# Patient Record
Sex: Male | Born: 1950
Health system: Southern US, Community
[De-identification: ages and names within clinical notes are randomized; demographics above are authoritative.]

## PROBLEM LIST (undated history)

## (undated) DIAGNOSIS — Z930 Tracheostomy status: Secondary | ICD-10-CM

## (undated) DIAGNOSIS — K219 Gastro-esophageal reflux disease without esophagitis: Secondary | ICD-10-CM

## (undated) DIAGNOSIS — I509 Heart failure, unspecified: Secondary | ICD-10-CM

## (undated) DIAGNOSIS — E785 Hyperlipidemia, unspecified: Secondary | ICD-10-CM

## (undated) DIAGNOSIS — M109 Gout, unspecified: Secondary | ICD-10-CM

## (undated) DIAGNOSIS — E119 Type 2 diabetes mellitus without complications: Secondary | ICD-10-CM

## (undated) HISTORY — PX: PACEMAKER PLACEMENT: SHX43

## (undated) HISTORY — PX: TRACHEOSTOMY: SUR1362

---

## 2004-04-08 ENCOUNTER — Other Ambulatory Visit: Payer: Self-pay

## 2004-04-08 ENCOUNTER — Emergency Department: Payer: Self-pay | Admitting: Emergency Medicine

## 2004-06-14 ENCOUNTER — Ambulatory Visit: Payer: Self-pay | Admitting: Internal Medicine

## 2014-09-01 DIAGNOSIS — Z95810 Presence of automatic (implantable) cardiac defibrillator: Secondary | ICD-10-CM | POA: Insufficient documentation

## 2015-01-01 DIAGNOSIS — Z720 Tobacco use: Secondary | ICD-10-CM | POA: Insufficient documentation

## 2015-01-01 DIAGNOSIS — I10 Essential (primary) hypertension: Secondary | ICD-10-CM | POA: Insufficient documentation

## 2015-01-01 DIAGNOSIS — E119 Type 2 diabetes mellitus without complications: Secondary | ICD-10-CM | POA: Insufficient documentation

## 2015-01-01 DIAGNOSIS — I2510 Atherosclerotic heart disease of native coronary artery without angina pectoris: Secondary | ICD-10-CM | POA: Insufficient documentation

## 2015-01-01 DIAGNOSIS — I5022 Chronic systolic (congestive) heart failure: Secondary | ICD-10-CM | POA: Insufficient documentation

## 2015-01-01 DIAGNOSIS — Z8709 Personal history of other diseases of the respiratory system: Secondary | ICD-10-CM | POA: Insufficient documentation

## 2015-01-01 DIAGNOSIS — J449 Chronic obstructive pulmonary disease, unspecified: Secondary | ICD-10-CM | POA: Insufficient documentation

## 2015-01-01 DIAGNOSIS — Z930 Tracheostomy status: Secondary | ICD-10-CM | POA: Insufficient documentation

## 2015-01-01 DIAGNOSIS — E785 Hyperlipidemia, unspecified: Secondary | ICD-10-CM | POA: Insufficient documentation

## 2015-01-03 DIAGNOSIS — I480 Paroxysmal atrial fibrillation: Secondary | ICD-10-CM | POA: Insufficient documentation

## 2018-07-11 ENCOUNTER — Encounter: Payer: Self-pay | Admitting: Intensive Care

## 2018-07-11 ENCOUNTER — Emergency Department
Admission: EM | Admit: 2018-07-11 | Discharge: 2018-07-11 | Disposition: A | Discharge status: Home / Self Care | Payer: Medicare Other | Attending: Emergency Medicine | Admitting: Emergency Medicine

## 2018-07-11 ENCOUNTER — Other Ambulatory Visit: Payer: Self-pay

## 2018-07-11 DIAGNOSIS — M79605 Pain in left leg: Secondary | ICD-10-CM | POA: Diagnosis present

## 2018-07-11 DIAGNOSIS — L03116 Cellulitis of left lower limb: Secondary | ICD-10-CM | POA: Diagnosis not present

## 2018-07-11 DIAGNOSIS — F17210 Nicotine dependence, cigarettes, uncomplicated: Secondary | ICD-10-CM | POA: Insufficient documentation

## 2018-07-11 DIAGNOSIS — I509 Heart failure, unspecified: Secondary | ICD-10-CM | POA: Diagnosis not present

## 2018-07-11 DIAGNOSIS — E119 Type 2 diabetes mellitus without complications: Secondary | ICD-10-CM | POA: Insufficient documentation

## 2018-07-11 HISTORY — DX: Heart failure, unspecified: I50.9

## 2018-07-11 HISTORY — DX: Type 2 diabetes mellitus without complications: E11.9

## 2018-07-11 LAB — CBC WITH DIFFERENTIAL/PLATELET
Abs Immature Granulocytes: 0.02 10*3/uL (ref 0.00–0.07)
Basophils Absolute: 0.1 10*3/uL (ref 0.0–0.1)
Basophils Relative: 2 %
Eosinophils Absolute: 0.1 10*3/uL (ref 0.0–0.5)
Eosinophils Relative: 1 %
HCT: 42.8 % (ref 39.0–52.0)
Hemoglobin: 13 g/dL (ref 13.0–17.0)
Immature Granulocytes: 0 %
Lymphocytes Relative: 10 %
Lymphs Abs: 0.6 10*3/uL — ABNORMAL LOW (ref 0.7–4.0)
MCH: 24.6 pg — ABNORMAL LOW (ref 26.0–34.0)
MCHC: 30.4 g/dL (ref 30.0–36.0)
MCV: 81.1 fL (ref 80.0–100.0)
Monocytes Absolute: 0.5 10*3/uL (ref 0.1–1.0)
Monocytes Relative: 9 %
Neutro Abs: 4.4 10*3/uL (ref 1.7–7.7)
Neutrophils Relative %: 78 %
Platelets: 116 10*3/uL — ABNORMAL LOW (ref 150–400)
RBC: 5.28 MIL/uL (ref 4.22–5.81)
RDW: 19.9 % — ABNORMAL HIGH (ref 11.5–15.5)
WBC: 5.7 10*3/uL (ref 4.0–10.5)
nRBC: 0 % (ref 0.0–0.2)

## 2018-07-11 LAB — COMPREHENSIVE METABOLIC PANEL
ALT: 20 U/L (ref 0–44)
AST: 18 U/L (ref 15–41)
Albumin: 3 g/dL — ABNORMAL LOW (ref 3.5–5.0)
Alkaline Phosphatase: 78 U/L (ref 38–126)
Anion gap: 11 (ref 5–15)
BUN: 51 mg/dL — ABNORMAL HIGH (ref 8–23)
CO2: 28 mmol/L (ref 22–32)
Calcium: 8.7 mg/dL — ABNORMAL LOW (ref 8.9–10.3)
Chloride: 93 mmol/L — ABNORMAL LOW (ref 98–111)
Creatinine, Ser: 1.54 mg/dL — ABNORMAL HIGH (ref 0.61–1.24)
GFR calc Af Amer: 53 mL/min — ABNORMAL LOW (ref >60)
GFR calc non Af Amer: 45 mL/min — ABNORMAL LOW (ref >60)
Glucose, Bld: 209 mg/dL — ABNORMAL HIGH (ref 70–99)
Potassium: 3.5 mmol/L (ref 3.5–5.1)
Sodium: 132 mmol/L — ABNORMAL LOW (ref 135–145)
Total Bilirubin: 1.6 mg/dL — ABNORMAL HIGH (ref 0.3–1.2)
Total Protein: 7.4 g/dL (ref 6.5–8.1)

## 2018-07-11 MED ORDER — DOXYCYCLINE HYCLATE 100 MG PO CAPS: 100.0000 mg | ORAL_CAPSULE | Freq: Two times a day (BID) | ORAL | 0 refills | Status: DC

## 2018-07-11 NOTE — ED Triage Notes (Signed)
Patient c/o cellulitis in left leg for a couple weeks. Reports draining blisters. New issue and has not spoke with PCP about. Denies cp or SOB

## 2018-07-11 NOTE — Discharge Instructions (Signed)
Please seek medical attention for any high fevers, chest pain, shortness of breath, change in behavior, persistent vomiting, bloody stool or any other new or concerning symptoms.  

## 2018-07-11 NOTE — ED Provider Notes (Signed)
[facility] Emergency Department Provider Note  ____________________________________________   I have reviewed the triage vital signs and the nursing notes.   HISTORY  Chief Complaint Cellulitis   History limited by: Not Limited   HPI Mohammad Katrinka BlazingSmith is a 68 y.o. male who presents to the emergency department today because of concern for left leg infection. The patient states that he has history of fluid retention and that it had gotten bad a couple of weeks ago. It then blistered and since then he has been worried he has had an infection in his leg. The patient states that he has had pain and some drainage. Says that he sought care today because the pain has continued to get worse. The patient denies any fevers. Denies any nausea or vomiting.   Records reviewed. Per medical record review patient has a history of CHF, DM.   Past Medical History:  Diagnosis Date  . CHF (congestive heart failure) (HCC)   . Diabetes mellitus without complication (HCC)     There are no active problems to display for this patient.   History reviewed. No pertinent surgical history.  Prior to Admission medications   Not on File    Allergies Patient has no known allergies.  History reviewed. No pertinent family history.  Social History Social History   Tobacco Use  . Smoking status: Current Every Day Smoker    Packs/day: 1.00    Types: Cigarettes  . Smokeless tobacco: Never Used  Substance Use Topics  . Alcohol use: Never    Frequency: Never  . Drug use: Never    Review of Systems Constitutional: No fever/chills Eyes: No visual changes. ENT: No sore throat. Cardiovascular: Denies chest pain. Respiratory: Denies shortness of breath. Gastrointestinal: No abdominal pain.  No nausea, no vomiting.  No diarrhea.   Genitourinary: Negative for dysuria. Musculoskeletal: Positive for left leg pain. Skin: Positive for swelling and redness to left leg. Neurological:  Negative for headaches, focal weakness or numbness.  ____________________________________________   PHYSICAL EXAM:  VITAL SIGNS: ED Triage Vitals  Enc Vitals Group     BP 07/11/18 1433 124/69     Pulse Rate 07/11/18 1433 78     Resp 07/11/18 1433 18     Temp 07/11/18 1429 98.1 F (36.7 C)     Temp Source 07/11/18 1429 Oral     SpO2 07/11/18 1433 98 %     Weight 07/11/18 1430 205 lb (93 kg)     Height 07/11/18 1430 5\' 2"  (1.575 m)     Head Circumference --      Peak Flow --      Pain Score 07/11/18 1430 6   Constitutional: Alert and oriented.  Eyes: Conjunctivae are normal.  ENT      Head: Normocephalic and atraumatic.      Nose: No congestion/rhinnorhea.      Mouth/Throat: Mucous membranes are moist.      Neck: No stridor. Hematological/Lymphatic/Immunilogical: No cervical lymphadenopathy. Cardiovascular: Normal rate, regular rhythm.  No murmurs, rubs, or gallops.  Respiratory: Normal respiratory effort without tachypnea nor retractions. Breath sounds are clear and equal bilaterally. No wheezes/rales/rhonchi. Gastrointestinal: Soft and non tender. No rebound. No guarding.  Genitourinary: Deferred Musculoskeletal: Swelling to left lower leg. Tender to palpation.  Neurologic:  Normal speech and language. No gross focal neurologic deficits are appreciated.  Skin:  Erythema and warmth to left lower leg. Small amount of drainage from two ulcer type wounds of the lower leg.  Psychiatric: Mood  and affect are normal. Speech and behavior are normal. Patient exhibits appropriate insight and judgment.  ____________________________________________    LABS (pertinent positives/negatives)  CBC wbc 5.7, hgb 13.0, plt 116 CMP na 132, cl 93, glu 209, cr 1.54  ____________________________________________   EKG  None  ____________________________________________     RADIOLOGY  None  ____________________________________________   PROCEDURES  Procedures  ____________________________________________   INITIAL IMPRESSION / ASSESSMENT AND PLAN / ED COURSE  Pertinent labs & imaging results that were available during my care of the patient were reviewed by me and considered in my medical decision making (see chart for details).   Patient presented because of concern for infection to his left lower leg. On exam patient does have erythema and tenderness to his left lower leg. Two areas of ulceration. Patient afebrile, no leukocytosis, at this time do not feel he is septic. Will give patient prescription for antibiotics. Will give patient wound care center follow up. Discussed return precautions with the patient.   ___________________________________________   FINAL CLINICAL IMPRESSION(S) / ED DIAGNOSES  Final diagnoses:  Cellulitis of left lower extremity     Note: This dictation was prepared with Dragon dictation. Any transcriptional errors that result from this process are unintentional     Phineas Semen, MD 07/11/18 1645

## 2018-07-11 NOTE — ED Notes (Signed)
Pt states fluid retention caused his to get blistered- blisters busted a couple weeks ago and now he thinks they are infected as they are not healing and hurt really bad

## 2018-07-17 ENCOUNTER — Other Ambulatory Visit: Payer: Self-pay

## 2018-07-17 ENCOUNTER — Emergency Department: Payer: Medicare Other

## 2018-07-17 ENCOUNTER — Emergency Department
Admission: EM | Admit: 2018-07-17 | Discharge: 2018-07-17 | Disposition: A | Discharge status: Other Acute Inpatient Hospital | Payer: Medicare Other | Attending: Emergency Medicine | Admitting: Emergency Medicine

## 2018-07-17 DIAGNOSIS — I110 Hypertensive heart disease with heart failure: Secondary | ICD-10-CM | POA: Insufficient documentation

## 2018-07-17 DIAGNOSIS — F17210 Nicotine dependence, cigarettes, uncomplicated: Secondary | ICD-10-CM | POA: Insufficient documentation

## 2018-07-17 DIAGNOSIS — R778 Other specified abnormalities of plasma proteins: Secondary | ICD-10-CM

## 2018-07-17 DIAGNOSIS — I5022 Chronic systolic (congestive) heart failure: Secondary | ICD-10-CM | POA: Diagnosis not present

## 2018-07-17 DIAGNOSIS — L03116 Cellulitis of left lower limb: Secondary | ICD-10-CM | POA: Diagnosis not present

## 2018-07-17 DIAGNOSIS — T148XXA Other injury of unspecified body region, initial encounter: Secondary | ICD-10-CM

## 2018-07-17 DIAGNOSIS — Z1159 Encounter for screening for other viral diseases: Secondary | ICD-10-CM | POA: Insufficient documentation

## 2018-07-17 DIAGNOSIS — R2242 Localized swelling, mass and lump, left lower limb: Secondary | ICD-10-CM | POA: Diagnosis present

## 2018-07-17 DIAGNOSIS — N189 Chronic kidney disease, unspecified: Secondary | ICD-10-CM

## 2018-07-17 DIAGNOSIS — E119 Type 2 diabetes mellitus without complications: Secondary | ICD-10-CM | POA: Insufficient documentation

## 2018-07-17 DIAGNOSIS — R7989 Other specified abnormal findings of blood chemistry: Secondary | ICD-10-CM | POA: Insufficient documentation

## 2018-07-17 DIAGNOSIS — L089 Local infection of the skin and subcutaneous tissue, unspecified: Secondary | ICD-10-CM

## 2018-07-17 LAB — LIPASE, BLOOD: Lipase: 27 U/L (ref 11–51)

## 2018-07-17 LAB — COMPREHENSIVE METABOLIC PANEL
ALT: 13 U/L (ref 0–44)
AST: 20 U/L (ref 15–41)
Albumin: 3.3 g/dL — ABNORMAL LOW (ref 3.5–5.0)
Alkaline Phosphatase: 83 U/L (ref 38–126)
Anion gap: 11 (ref 5–15)
BUN: 71 mg/dL — ABNORMAL HIGH (ref 8–23)
CO2: 30 mmol/L (ref 22–32)
Calcium: 8.7 mg/dL — ABNORMAL LOW (ref 8.9–10.3)
Chloride: 96 mmol/L — ABNORMAL LOW (ref 98–111)
Creatinine, Ser: 1.77 mg/dL — ABNORMAL HIGH (ref 0.61–1.24)
GFR calc Af Amer: 44 mL/min — ABNORMAL LOW (ref >60)
GFR calc non Af Amer: 38 mL/min — ABNORMAL LOW (ref >60)
Glucose, Bld: 136 mg/dL — ABNORMAL HIGH (ref 70–99)
Potassium: 3.3 mmol/L — ABNORMAL LOW (ref 3.5–5.1)
Sodium: 137 mmol/L (ref 135–145)
Total Bilirubin: 1 mg/dL (ref 0.3–1.2)
Total Protein: 7.9 g/dL (ref 6.5–8.1)

## 2018-07-17 LAB — CBC WITH DIFFERENTIAL/PLATELET
Abs Immature Granulocytes: 0.02 10*3/uL (ref 0.00–0.07)
Basophils Absolute: 0.1 10*3/uL (ref 0.0–0.1)
Basophils Relative: 2 %
Eosinophils Absolute: 0.1 10*3/uL (ref 0.0–0.5)
Eosinophils Relative: 2 %
HCT: 42.7 % (ref 39.0–52.0)
Hemoglobin: 13 g/dL (ref 13.0–17.0)
Immature Granulocytes: 0 %
Lymphocytes Relative: 11 %
Lymphs Abs: 0.6 10*3/uL — ABNORMAL LOW (ref 0.7–4.0)
MCH: 25 pg — ABNORMAL LOW (ref 26.0–34.0)
MCHC: 30.4 g/dL (ref 30.0–36.0)
MCV: 82 fL (ref 80.0–100.0)
Monocytes Absolute: 0.8 10*3/uL (ref 0.1–1.0)
Monocytes Relative: 13 %
Neutro Abs: 4 10*3/uL (ref 1.7–7.7)
Neutrophils Relative %: 72 %
Platelets: 136 10*3/uL — ABNORMAL LOW (ref 150–400)
RBC: 5.21 MIL/uL (ref 4.22–5.81)
RDW: 19.9 % — ABNORMAL HIGH (ref 11.5–15.5)
WBC: 5.6 10*3/uL (ref 4.0–10.5)
nRBC: 0 % (ref 0.0–0.2)

## 2018-07-17 LAB — PROTIME-INR
INR: 1.7 — ABNORMAL HIGH (ref 0.8–1.2)
Prothrombin Time: 19.5 seconds — ABNORMAL HIGH (ref 11.4–15.2)

## 2018-07-17 LAB — BLOOD GAS, VENOUS
Acid-Base Excess: 5.8 mmol/L — ABNORMAL HIGH (ref 0.0–2.0)
Bicarbonate: 31.7 mmol/L — ABNORMAL HIGH (ref 20.0–28.0)
O2 Saturation: 62.4 %
Patient temperature: 37
pCO2, Ven: 50 mmHg (ref 44.0–60.0)
pH, Ven: 7.41 (ref 7.250–7.430)
pO2, Ven: 32 mmHg (ref 32.0–45.0)

## 2018-07-17 LAB — TROPONIN I: Troponin I: 0.03 ng/mL (ref <0.03)

## 2018-07-17 LAB — BRAIN NATRIURETIC PEPTIDE: B Natriuretic Peptide: 1207 pg/mL — ABNORMAL HIGH (ref 0.0–100.0)

## 2018-07-17 LAB — LACTIC ACID, PLASMA: Lactic Acid, Venous: 1.1 mmol/L (ref 0.5–1.9)

## 2018-07-17 LAB — PROCALCITONIN: Procalcitonin: 0.19 ng/mL

## 2018-07-17 LAB — SARS CORONAVIRUS 2 BY RT PCR (HOSPITAL ORDER, PERFORMED IN ~~LOC~~ HOSPITAL LAB): SARS Coronavirus 2: NEGATIVE

## 2018-07-17 MED ORDER — MORPHINE SULFATE (PF) 4 MG/ML IV SOLN
4.0000 mg | Freq: Once | INTRAVENOUS | Status: AC
  Administered 2018-07-17: 4 mg via INTRAVENOUS
  Filled 2018-07-17: qty 1

## 2018-07-17 MED ORDER — PIPERACILLIN-TAZOBACTAM 3.375 G IVPB 30 MIN
3.3750 g | Freq: Once | INTRAVENOUS | Status: AC
  Administered 2018-07-17: 3.375 g via INTRAVENOUS
  Filled 2018-07-17: qty 50

## 2018-07-17 MED ORDER — VANCOMYCIN HCL IN DEXTROSE 1-5 GM/200ML-% IV SOLN: 1000.0000 mg | Freq: Once | INTRAVENOUS | Status: DC

## 2018-07-17 MED ORDER — VANCOMYCIN HCL 1.25 G IV SOLR: 1250.0000 mg | INTRAVENOUS | Status: DC

## 2018-07-17 MED ORDER — ONDANSETRON HCL 4 MG/2ML IJ SOLN
4.0000 mg | INTRAMUSCULAR | Status: AC
  Administered 2018-07-17: 4 mg via INTRAVENOUS
  Filled 2018-07-17: qty 2

## 2018-07-17 MED ORDER — VANCOMYCIN HCL 10 G IV SOLR
2000.0000 mg | Freq: Once | INTRAVENOUS | Status: AC
  Administered 2018-07-17: 2000 mg via INTRAVENOUS
  Filled 2018-07-17: qty 2000

## 2018-07-17 MED ORDER — MORPHINE SULFATE (PF) 2 MG/ML IV SOLN
2.0000 mg | Freq: Once | INTRAVENOUS | Status: AC
Start: 2018-07-17 — End: 2018-07-17
  Administered 2018-07-17: 2 mg via INTRAVENOUS
  Filled 2018-07-17: qty 1

## 2018-07-17 NOTE — ED Notes (Signed)
Given water

## 2018-07-17 NOTE — ED Provider Notes (Addendum)
[facility] Emergency Department Provider Note  ____________________________________________   First MD Initiated Contact with Patient 07/17/18 [phone]     (approximate)  I have reviewed the triage vital signs and the nursing notes.   HISTORY  Chief Complaint Leg Swelling    HPI Danny Glover is a 68 y.o. male with extensive chronic medical history  as listed below in past medical history and active problem list.  He presents by EMS for evaluation of worsening pain, swelling, redness, and weeping of fluid from his left lower leg.  He was seen in this emergency department about 6 days ago for worsening wound and started on antibiotics for probable wound infection.  He says he has been taking his antibiotics and all of his regular medications but the wound is gotten progressively more painful and he is having more redness and swelling in the left leg although it is difficult for him to see it.  He has extensive chronic skin changes in bilateral lower extremities and feet but he has 2 wounds that have been present but seem to be getting worse on the left lower leg.  The one on the medial aspect of the leg seems to be doing okay but the one on the lateral aspect just below the knee is getting worse.  He is having some cough and a little bit of shortness of breath but he thinks that is due to the worsening swelling which he attributes to an increased fluid volume.  He says he has been taking his medicines which includes torsemide.  He also says that he takes a blood thinner which he believes is warfarin.  He denies fever/chills, chest pain, nausea, vomiting, and abdominal pain.  He has no one at home to help him and has been isolating from potential COVID-19 patients.  He has a chronic tracheostomy and he has not been worried about his respiratory symptoms and attributes them to fluid buildup.  He describes the pain in his left lower leg is severe and nothing in particular makes it  better or worse and it is been getting worse in spite of the antibiotics he received.        Past Medical History:  Diagnosis Date   CHF (congestive heart failure) (HCC)    Diabetes mellitus without complication [facility](HCC)     Patient Active Problem List   Diagnosis Date Noted   Paroxysmal atrial fibrillation (HCC) 01/03/2015   Chronic obstructive pulmonary disease (HCC) 01/01/2015   Chronic systolic (congestive) heart failure (HCC) 01/01/2015   Controlled type 2 diabetes mellitus without complication (HCC) 01/01/2015   Coronary artery disease involving native coronary artery 01/01/2015   Essential hypertension 01/01/2015   History of vocal cord paralysis 01/01/2015   Hyperlipidemia 01/01/2015   Tobacco abuse 01/01/2015   Tracheostomy dependent (HCC) 01/01/2015   Biventricular ICD (implantable cardioverter-defibrillator) in place 09/01/2014    History reviewed. No pertinent surgical history.  Prior to Admission medications   Medication Sig Start Date End Date Taking? Authorizing Provider  doxycycline (VIBRAMYCIN) 100 MG capsule Take 1 capsule (100 mg total) by mouth 2 (two) times daily for 10 days. 07/11/18 07/21/18  Phineas SemenGoodman, Graydon, MD    Allergies Patient has no known allergies.  History reviewed. No pertinent family history.  Social History Social History   Tobacco Use   Smoking status: Current Every Day Smoker    Packs/day: 1.00    Types: Cigarettes   Smokeless tobacco: Never Used  Substance Use Topics   Alcohol  use: Never    Frequency: Never   Drug use: Never    Review of Systems Constitutional: No fever/chills Eyes: No visual changes. ENT: No sore throat. Cardiovascular: Denies chest pain. Respiratory: Some shortness of breath and mild cough.  Chronic tracheostomy. Gastrointestinal: No abdominal pain.  No nausea, no vomiting.  No diarrhea.  No constipation. Genitourinary: Negative for dysuria. Musculoskeletal: Pain in left lower leg with  some redness and swelling.  Negative for neck pain.  Negative for back pain. Integumentary: Worsening wound infection on left lower leg. Neurological: Negative for headaches, focal weakness or numbness.   ____________________________________________   PHYSICAL EXAM:  VITAL SIGNS: ED Triage Vitals  Enc Vitals Group     BP 07/17/18 0443 135/79     Pulse Rate 07/17/18 0443 77     Resp 07/17/18 0443 20     Temp 07/17/18 0443 97.6 F (36.4 C)     Temp Source 07/17/18 0443 Oral     SpO2 07/17/18 0434 98 %     Weight 07/17/18 0444 93 kg (205 lb)     Height 07/17/18 0444 1.753 m ( )     Head Circumference --      Peak Flow --      Pain Score 07/17/18 0444 10     Pain Loc --      Pain Edu? --      Excl. in GC? --     Constitutional: Alert and oriented.  Appears chronically ill and uncomfortable but is not in acute distress right now. Eyes: Conjunctivae are normal.  Head: Atraumatic. Nose: No congestion/rhinnorhea. Mouth/Throat: Mucous membranes are moist. Neck: No stridor.  No meningeal signs.   Cardiovascular: Normal rate, regular rhythm. Good peripheral circulation. Grossly normal heart sounds. Respiratory: Slightly increased respiratory effort with some intercostal muscle usage.  No audible wheezing.  Coarse breath sounds particularly in the bases.  Chronic tracheostomy. Gastrointestinal: Soft and nontender. No distention.  Musculoskeletal: Skin changes of the bilateral lower extremities as listed below.  There is some trace edema in bilateral lower extremities.  Severe chronic skin changes to the feet but no evidence of acute infection on his feet.  He is missing several toenails including the great toenail on the right foot that appears to have been ripped off at some point but the patient is unaware of when this may have happened.  See below for a photo. Neurologic:  Normal speech and language. No gross focal neurologic deficits are appreciated.  Skin:  Skin is warm, dry,  pallor.  Extensive chronic skin changes in bilateral lower extremities.  The left lower extremity has 2 large chronic appearing wounds with a central eschar, one on the lateral aspect just below the knee and the other on the posterior medial aspect further down the leg.  The lateral wound appears acutely infected and is weeping fluid and is mildly purulent.  It is at least 3 cm in diameter and has extensive surrounding cellulitis that goes up to the knee.  It is very tender to palpation.      ____________________________________________   LABS (all labs ordered are listed, but only abnormal results are displayed)  Labs Reviewed  COMPREHENSIVE METABOLIC PANEL - Abnormal; Notable for the following components:      Result Value   Potassium 3.3 (*)    Chloride 96 (*)    Glucose, Bld 136 (*)    BUN 71 (*)    Creatinine, Ser 1.77 (*)    Calcium 8.7 (*)  Albumin 3.3 (*)    GFR calc non Af Amer 38 (*)    GFR calc Af Amer 44 (*)    All other components within normal limits  BRAIN NATRIURETIC PEPTIDE - Abnormal; Notable for the following components:   B Natriuretic Peptide 1,207.0 (*)    All other components within normal limits  TROPONIN I - Abnormal; Notable for the following components:   Troponin I 0.03 (*)    All other components within normal limits  CBC WITH DIFFERENTIAL/PLATELET - Abnormal; Notable for the following components:   MCH 25.0 (*)    RDW 19.9 (*)    Platelets 136 (*)    Lymphs Abs 0.6 (*)    All other components within normal limits  BLOOD GAS, VENOUS - Abnormal; Notable for the following components:   Bicarbonate 31.7 (*)    Acid-Base Excess 5.8 (*)    All other components within normal limits  PROTIME-INR - Abnormal; Notable for the following components:   Prothrombin Time 19.5 (*)    INR 1.7 (*)    All other components within normal limits  SARS CORONAVIRUS 2 (HOSPITAL ORDER, PERFORMED IN [facility])  CULTURE, BLOOD (ROUTINE X 2)  CULTURE,  BLOOD (ROUTINE X 2)  AEROBIC/ANAEROBIC CULTURE (SURGICAL/DEEP WOUND)  LIPASE, BLOOD  LACTIC ACID, PLASMA  PROCALCITONIN  URINALYSIS, COMPLETE (UACMP) WITH MICROSCOPIC   ____________________________________________  EKG  ED ECG REPORT I, Loleta Rose, the attending physician, personally viewed and interpreted this ECG.  Date: 07/17/2018 EKG Time: 4:40 AM Rate: 82 Rhythm: Atrial sensed ventricular paced rhythm QRS Axis: Atrial sensed ventricular paced rhythm Intervals: Atrial sensed ventricular paced rhythm ST/T Wave abnormalities: Non-specific ST segment / T-wave changes, but no clear evidence of acute ischemia. Narrative Interpretation: no definitive evidence of acute ischemia; does not meet STEMI criteria.   ____________________________________________  RADIOLOGY I, Loleta Rose, personally viewed and evaluated these images (plain radiographs) as part of my medical decision making, as well as reviewing the written report by the radiologist.  ED MD interpretation:  Chronic scarring vs acute infection, favor the former  Official radiology report(s): Dg Chest Port 1 View  Result Date: 07/17/2018 CLINICAL DATA:  68 year old male with fluid retention. Shortness of breath and leg swelling. EXAM: PORTABLE CHEST 1 VIEW COMPARISON:  None available. FINDINGS: Portable AP upright view at 0446 hours. Tracheostomy tube in place. Left chest cardiac AICD. Cardiomegaly. Prior CABG. Other mediastinal contours are within normal limits. Patchy right mid lung opacity and veiling/blunting of the right lung base and costophrenic angle. The hypo ventilation at the left base without confluent opacity. No pneumothorax, and no overt edema. Upper lung vascularity appears at the upper limits of normal. Negative visible bowel gas pattern. No acute osseous abnormality identified. IMPRESSION: 1. No prior images for comparison. 2. Patchy right mid and lower lung opacity with blunting of the right costophrenic  angle. This more resembles infection or chronic scarring than a pleural effusion. 3. Cardiomegaly without overt pulmonary edema. Electronically Signed   By: Odessa Fleming M.D.   On: 07/17/2018 05:08    ____________________________________________   PROCEDURES   Procedure(s) performed (including Critical Care):  Procedures   ____________________________________________   INITIAL IMPRESSION / MDM / ASSESSMENT AND PLAN / ED COURSE  As part of my medical decision making, I reviewed the following data within the electronic MEDICAL RECORD NUMBER Nursing notes reviewed and incorporated, Labs reviewed , EKG interpreted , Old chart reviewed, Radiograph reviewed  and Notes from prior ED visits.  Contacting [facility] [place] for transfer.      *Jeferson Carroll was evaluated in Emergency Department on 07/17/2018 for the symptoms described in the history of present illness. He was evaluated in the context of the global COVID-19 pandemic, which necessitated consideration that the patient might be at risk for infection with the SARS-CoV-2 virus that causes COVID-19. Institutional protocols and algorithms that pertain to the evaluation of patients at risk for COVID-19 are in a state of rapid change based on information released by regulatory bodies including the CDC and federal and state organizations. These policies and algorithms were followed during the patient's care in the ED.*  Differential diagnosis includes, but is not limited to, cellulitis of left lower extremity secondary to wound infection unresponsive to outpatient antibiotics, DVT, necrotizing fasciitis, CHF exacerbation, pneumonia, sepsis.  The patient does not (yet) meet criteria for sepsis as his vital signs are stable and he is afebrile.  However it appears that his leg is been getting worse.  The physician that saw him 6 days ago happens to be working tonight as well and he evaluated the patient with me and said that the left lower leg looks worse with more  surrounding cellulitis than was present on the initial visit 6 days ago in spite of the use of outpatient antibiotics.  I will check blood cultures, a wound culture of the lateral wound, and evaluate broadly.  However I feel that he will likely need to be admitted for IV antibiotics and most likely for vascular surgery consultation.  However the patient is a [place] patient and goes to [place] so when I have sufficient information available I will contact the [facility] [place] for possible transfer.  A COVID-19 swab has been sent.  The patient is in no respiratory distress at this time.  Clinical Course as of Jul 16 737  Tue Jul 17, 2018  4098 Unlikely to represent ACS, possibly a small element of demand ischemia, more likely representative of acute on chronic renal insufficiency.  Troponin I(!!): 0.03 [CF]  0524 No leukocytosis, stable hemoglobin  CBC with Differential(!) [CF]  0525 Increased creatinine from last week's ED visit, but uncertain of baseline at [facility].  Creatinine(!): 1.77 [CF]  0527 chronic scarring vs infection  DG Chest [facility] [CF]  [phone] SARS Coronavirus 2: NEGATIVE [CF]  0635 Blood cultures are pending.  Venous blood gases generally reassuring with no evidence of significant hypercapnia.   [CF]  0636 B Natriuretic Peptide(!): 1,207.0 [CF]  0636 Patient's presentation is most consistent with worsening cellulitis of left lower extremity refractory to outpatient antibiotics (he was taking doxycycline for about a week).  I have ordered Zosyn 3.375 g IV and vancomycin 2 g IV to begin empiric treatment.  Wound culture is pending and blood cultures are pending.  I am filling out paperwork for transfer to the [facility] if a bed is available.  If they do not have room for him then he should be admitted to this facility for her cellulitis treatment and volume overload/CHF exacerbation treatment.  He agrees with the plan.   [CF]  [phone] Still waiting to hear back from the [facility].  Transferred ED  care to Dr. Lenard Lance to follow-up.   [CF]    Clinical Course User Index [CF] Loleta Rose, MD     ____________________________________________  FINAL CLINICAL IMPRESSION(S) / ED DIAGNOSES  Final diagnoses:  Cellulitis of left lower leg  Wound infection  Elevated troponin I level  Acute on chronic  renal insufficiency     MEDICATIONS GIVEN DURING THIS VISIT:  Medications  vancomycin (VANCOCIN) 2,000 mg in sodium chloride 0.9 % 500 mL IVPB (2,000 mg Intravenous New Bag/Given 07/17/18 0549)  vancomycin (VANCOCIN) 1,250 mg in sodium chloride 0.9 % 250 mL IVPB (has no administration in time range)  morphine 4 MG/ML injection 4 mg (has no administration in time range)  ondansetron (ZOFRAN) injection 4 mg (has no administration in time range)  piperacillin-tazobactam (ZOSYN) IVPB 3.375 g (0 g Intravenous Stopped 07/17/18 0610)     ED Discharge Orders    None       Note:  This document was prepared using Dragon voice recognition software and may include unintentional dictation errors.   Loleta Rose, MD 07/17/18 8315    Loleta Rose, MD 07/17/18 [phone]

## 2018-07-17 NOTE — ED Notes (Signed)
Leaving with [facility] to [facility] hospital. Stable on discharge. NAD.

## 2018-07-17 NOTE — ED Notes (Signed)
Meal tray ordered 

## 2018-07-17 NOTE — ED Notes (Signed)
[facility]  CALLED  FOR  TRANSPORT TO  [place] 

## 2018-07-17 NOTE — ED Notes (Signed)
EMTALA reviewed by charge RN 

## 2018-07-17 NOTE — Consult Note (Signed)
Pharmacy Antibiotic Note  Danny Glover is a 68 y.o. male admitted on 07/17/2018 with cellulitis.  Pharmacy has been consulted for vancomycin dosing.  Plan: Vancomycin 2000mg  IV x 1 dose, followed by Vancomycin 1250  mg IV Q 36  hrs. Goal AUC 400-550. Expected AUC: 483 SCr used: 1.77  Will monitor renal function and adjust dose as needed.   Height: 5\' 9"  (175.3 cm) Weight: 205 lb (93 kg) IBW/kg (Calculated) : 70.7  Temp (24hrs), Avg:97.6 F (36.4 C), Min:97.6 F (36.4 C), Max:97.6 F (36.4 C)  Recent Labs  Lab 07/11/18 1440 07/17/18 0445 07/17/18 0526  WBC 5.7 5.6  --   CREATININE 1.54* 1.77*  --   LATICACIDVEN  --   --  1.1    Estimated Creatinine Clearance: 44.3 mL/min (A) (by C-G formula based on SCr of 1.77 mg/dL (H)).    No Known Allergies  Antimicrobials this admission: 5/12 vancomycin>>  5/12 Zosyn  >>   Microbiology results: 5/12 BCx: pending 0446 SARS Coronavirus 2: negative   Thank you for allowing pharmacy to be a part of this patient's care.  Gardner Candle, PharmD, BCPS Clinical Pharmacist 07/17/2018 7:03 AM

## 2018-07-17 NOTE — ED Notes (Signed)
Copy  Of  [facility]ray  [facility]  With  Pt  [facility]

## 2018-07-17 NOTE — ED Notes (Signed)
ALL  PAPERWORK  FAXED  TO  [place]  [facility] 

## 2018-07-17 NOTE — ED Notes (Signed)
Patients mother called wanting update and info. Discussed with patient and he does not want her to have any information at this time.  Informed mother that cannot tell her anything at this time.

## 2018-07-17 NOTE — ED Notes (Signed)
[facility]  STILL  BEING  REVIEWED BY  MD INFORMED  VALERIE  RN  AND  DR Lenard Lance  MD

## 2018-07-17 NOTE — ED Notes (Signed)
Pt alert and oriented. No needs at this time. Unlabored. Waiting on possible transfer to [facility] hospital.  Rails up X 2 for safety.

## 2018-07-17 NOTE — ED Notes (Signed)
Given meal tray.

## 2018-07-17 NOTE — ED Triage Notes (Signed)
68 yo male from home by [facility] with complaints of bilateral edema with the left weeping. Per patient he had a fall yesterday. Patient is complaining of pain bilateral in legs 10/10. Patient's left leg is weeping clear fluid and has multiple sores on leg. On interior of left lower leg is area is red with swelling and on outer left lower leg is area 2 in diameter area that is blackened and weeping. Patient states he was just seen because of legs and was placed and ABT and has been taking medications.

## 2018-07-22 LAB — CULTURE, BLOOD (ROUTINE X 2)
Culture: NO GROWTH
Culture: NO GROWTH
Special Requests: ADEQUATE
Special Requests: ADEQUATE

## 2018-07-22 LAB — AEROBIC/ANAEROBIC CULTURE W GRAM STAIN (SURGICAL/DEEP WOUND): Gram Stain: NONE SEEN

## 2018-07-22 LAB — AEROBIC/ANAEROBIC CULTURE (SURGICAL/DEEP WOUND): Special Requests: NORMAL

## 2018-11-06 DIAGNOSIS — J449 Chronic obstructive pulmonary disease, unspecified: Secondary | ICD-10-CM | POA: Diagnosis not present

## 2018-11-06 DIAGNOSIS — I130 Hypertensive heart and chronic kidney disease with heart failure and stage 1 through stage 4 chronic kidney disease, or unspecified chronic kidney disease: Secondary | ICD-10-CM | POA: Diagnosis not present

## 2018-11-06 DIAGNOSIS — Z7984 Long term (current) use of oral hypoglycemic drugs: Secondary | ICD-10-CM | POA: Diagnosis not present

## 2018-11-06 DIAGNOSIS — N182 Chronic kidney disease, stage 2 (mild): Secondary | ICD-10-CM | POA: Diagnosis not present

## 2018-11-06 DIAGNOSIS — E1140 Type 2 diabetes mellitus with diabetic neuropathy, unspecified: Secondary | ICD-10-CM | POA: Diagnosis not present

## 2018-11-06 DIAGNOSIS — I5030 Unspecified diastolic (congestive) heart failure: Secondary | ICD-10-CM | POA: Diagnosis not present

## 2018-11-06 DIAGNOSIS — I4891 Unspecified atrial fibrillation: Secondary | ICD-10-CM | POA: Diagnosis not present

## 2018-11-06 DIAGNOSIS — I872 Venous insufficiency (chronic) (peripheral): Secondary | ICD-10-CM | POA: Diagnosis not present

## 2018-11-06 DIAGNOSIS — Z930 Tracheostomy status: Secondary | ICD-10-CM | POA: Diagnosis not present

## 2018-11-06 DIAGNOSIS — I2510 Atherosclerotic heart disease of native coronary artery without angina pectoris: Secondary | ICD-10-CM | POA: Diagnosis not present

## 2018-11-06 DIAGNOSIS — E1122 Type 2 diabetes mellitus with diabetic chronic kidney disease: Secondary | ICD-10-CM | POA: Diagnosis not present

## 2018-11-06 DIAGNOSIS — L97221 Non-pressure chronic ulcer of left calf limited to breakdown of skin: Secondary | ICD-10-CM | POA: Diagnosis not present

## 2018-11-08 DIAGNOSIS — I872 Venous insufficiency (chronic) (peripheral): Secondary | ICD-10-CM | POA: Diagnosis not present

## 2018-11-08 DIAGNOSIS — N182 Chronic kidney disease, stage 2 (mild): Secondary | ICD-10-CM | POA: Diagnosis not present

## 2018-11-08 DIAGNOSIS — E1122 Type 2 diabetes mellitus with diabetic chronic kidney disease: Secondary | ICD-10-CM | POA: Diagnosis not present

## 2018-11-08 DIAGNOSIS — J449 Chronic obstructive pulmonary disease, unspecified: Secondary | ICD-10-CM | POA: Diagnosis not present

## 2018-11-08 DIAGNOSIS — I2510 Atherosclerotic heart disease of native coronary artery without angina pectoris: Secondary | ICD-10-CM | POA: Diagnosis not present

## 2018-11-08 DIAGNOSIS — I5030 Unspecified diastolic (congestive) heart failure: Secondary | ICD-10-CM | POA: Diagnosis not present

## 2018-11-08 DIAGNOSIS — E1140 Type 2 diabetes mellitus with diabetic neuropathy, unspecified: Secondary | ICD-10-CM | POA: Diagnosis not present

## 2018-11-08 DIAGNOSIS — L97221 Non-pressure chronic ulcer of left calf limited to breakdown of skin: Secondary | ICD-10-CM | POA: Diagnosis not present

## 2018-11-08 DIAGNOSIS — Z7984 Long term (current) use of oral hypoglycemic drugs: Secondary | ICD-10-CM | POA: Diagnosis not present

## 2018-11-08 DIAGNOSIS — I4891 Unspecified atrial fibrillation: Secondary | ICD-10-CM | POA: Diagnosis not present

## 2018-11-08 DIAGNOSIS — I130 Hypertensive heart and chronic kidney disease with heart failure and stage 1 through stage 4 chronic kidney disease, or unspecified chronic kidney disease: Secondary | ICD-10-CM | POA: Diagnosis not present

## 2018-11-08 DIAGNOSIS — Z930 Tracheostomy status: Secondary | ICD-10-CM | POA: Diagnosis not present

## 2018-11-09 DIAGNOSIS — I4891 Unspecified atrial fibrillation: Secondary | ICD-10-CM | POA: Diagnosis not present

## 2018-11-09 DIAGNOSIS — E1122 Type 2 diabetes mellitus with diabetic chronic kidney disease: Secondary | ICD-10-CM | POA: Diagnosis not present

## 2018-11-09 DIAGNOSIS — Z7984 Long term (current) use of oral hypoglycemic drugs: Secondary | ICD-10-CM | POA: Diagnosis not present

## 2018-11-09 DIAGNOSIS — I2510 Atherosclerotic heart disease of native coronary artery without angina pectoris: Secondary | ICD-10-CM | POA: Diagnosis not present

## 2018-11-09 DIAGNOSIS — I130 Hypertensive heart and chronic kidney disease with heart failure and stage 1 through stage 4 chronic kidney disease, or unspecified chronic kidney disease: Secondary | ICD-10-CM | POA: Diagnosis not present

## 2018-11-09 DIAGNOSIS — N182 Chronic kidney disease, stage 2 (mild): Secondary | ICD-10-CM | POA: Diagnosis not present

## 2018-11-09 DIAGNOSIS — E1140 Type 2 diabetes mellitus with diabetic neuropathy, unspecified: Secondary | ICD-10-CM | POA: Diagnosis not present

## 2018-11-09 DIAGNOSIS — I5030 Unspecified diastolic (congestive) heart failure: Secondary | ICD-10-CM | POA: Diagnosis not present

## 2018-11-09 DIAGNOSIS — J449 Chronic obstructive pulmonary disease, unspecified: Secondary | ICD-10-CM | POA: Diagnosis not present

## 2018-11-09 DIAGNOSIS — I872 Venous insufficiency (chronic) (peripheral): Secondary | ICD-10-CM | POA: Diagnosis not present

## 2018-11-09 DIAGNOSIS — L97221 Non-pressure chronic ulcer of left calf limited to breakdown of skin: Secondary | ICD-10-CM | POA: Diagnosis not present

## 2018-11-09 DIAGNOSIS — Z930 Tracheostomy status: Secondary | ICD-10-CM | POA: Diagnosis not present

## 2018-11-12 DIAGNOSIS — I872 Venous insufficiency (chronic) (peripheral): Secondary | ICD-10-CM | POA: Diagnosis not present

## 2018-11-12 DIAGNOSIS — I2510 Atherosclerotic heart disease of native coronary artery without angina pectoris: Secondary | ICD-10-CM | POA: Diagnosis not present

## 2018-11-12 DIAGNOSIS — Z7984 Long term (current) use of oral hypoglycemic drugs: Secondary | ICD-10-CM | POA: Diagnosis not present

## 2018-11-12 DIAGNOSIS — L97221 Non-pressure chronic ulcer of left calf limited to breakdown of skin: Secondary | ICD-10-CM | POA: Diagnosis not present

## 2018-11-12 DIAGNOSIS — I5030 Unspecified diastolic (congestive) heart failure: Secondary | ICD-10-CM | POA: Diagnosis not present

## 2018-11-12 DIAGNOSIS — I130 Hypertensive heart and chronic kidney disease with heart failure and stage 1 through stage 4 chronic kidney disease, or unspecified chronic kidney disease: Secondary | ICD-10-CM | POA: Diagnosis not present

## 2018-11-12 DIAGNOSIS — E1122 Type 2 diabetes mellitus with diabetic chronic kidney disease: Secondary | ICD-10-CM | POA: Diagnosis not present

## 2018-11-12 DIAGNOSIS — N182 Chronic kidney disease, stage 2 (mild): Secondary | ICD-10-CM | POA: Diagnosis not present

## 2018-11-12 DIAGNOSIS — E1140 Type 2 diabetes mellitus with diabetic neuropathy, unspecified: Secondary | ICD-10-CM | POA: Diagnosis not present

## 2018-11-12 DIAGNOSIS — I4891 Unspecified atrial fibrillation: Secondary | ICD-10-CM | POA: Diagnosis not present

## 2018-11-12 DIAGNOSIS — J449 Chronic obstructive pulmonary disease, unspecified: Secondary | ICD-10-CM | POA: Diagnosis not present

## 2018-11-12 DIAGNOSIS — Z930 Tracheostomy status: Secondary | ICD-10-CM | POA: Diagnosis not present

## 2018-11-13 DIAGNOSIS — E1140 Type 2 diabetes mellitus with diabetic neuropathy, unspecified: Secondary | ICD-10-CM | POA: Diagnosis not present

## 2018-11-13 DIAGNOSIS — Z7984 Long term (current) use of oral hypoglycemic drugs: Secondary | ICD-10-CM | POA: Diagnosis not present

## 2018-11-13 DIAGNOSIS — N182 Chronic kidney disease, stage 2 (mild): Secondary | ICD-10-CM | POA: Diagnosis not present

## 2018-11-13 DIAGNOSIS — I2510 Atherosclerotic heart disease of native coronary artery without angina pectoris: Secondary | ICD-10-CM | POA: Diagnosis not present

## 2018-11-13 DIAGNOSIS — L97221 Non-pressure chronic ulcer of left calf limited to breakdown of skin: Secondary | ICD-10-CM | POA: Diagnosis not present

## 2018-11-13 DIAGNOSIS — Z930 Tracheostomy status: Secondary | ICD-10-CM | POA: Diagnosis not present

## 2018-11-13 DIAGNOSIS — I130 Hypertensive heart and chronic kidney disease with heart failure and stage 1 through stage 4 chronic kidney disease, or unspecified chronic kidney disease: Secondary | ICD-10-CM | POA: Diagnosis not present

## 2018-11-13 DIAGNOSIS — I4891 Unspecified atrial fibrillation: Secondary | ICD-10-CM | POA: Diagnosis not present

## 2018-11-13 DIAGNOSIS — E1122 Type 2 diabetes mellitus with diabetic chronic kidney disease: Secondary | ICD-10-CM | POA: Diagnosis not present

## 2018-11-13 DIAGNOSIS — I872 Venous insufficiency (chronic) (peripheral): Secondary | ICD-10-CM | POA: Diagnosis not present

## 2018-11-13 DIAGNOSIS — I5030 Unspecified diastolic (congestive) heart failure: Secondary | ICD-10-CM | POA: Diagnosis not present

## 2018-11-13 DIAGNOSIS — J449 Chronic obstructive pulmonary disease, unspecified: Secondary | ICD-10-CM | POA: Diagnosis not present

## 2018-11-15 DIAGNOSIS — E1122 Type 2 diabetes mellitus with diabetic chronic kidney disease: Secondary | ICD-10-CM | POA: Diagnosis not present

## 2018-11-15 DIAGNOSIS — I4891 Unspecified atrial fibrillation: Secondary | ICD-10-CM | POA: Diagnosis not present

## 2018-11-15 DIAGNOSIS — Z930 Tracheostomy status: Secondary | ICD-10-CM | POA: Diagnosis not present

## 2018-11-15 DIAGNOSIS — L97221 Non-pressure chronic ulcer of left calf limited to breakdown of skin: Secondary | ICD-10-CM | POA: Diagnosis not present

## 2018-11-15 DIAGNOSIS — N182 Chronic kidney disease, stage 2 (mild): Secondary | ICD-10-CM | POA: Diagnosis not present

## 2018-11-15 DIAGNOSIS — I2510 Atherosclerotic heart disease of native coronary artery without angina pectoris: Secondary | ICD-10-CM | POA: Diagnosis not present

## 2018-11-15 DIAGNOSIS — E1140 Type 2 diabetes mellitus with diabetic neuropathy, unspecified: Secondary | ICD-10-CM | POA: Diagnosis not present

## 2018-11-15 DIAGNOSIS — I5030 Unspecified diastolic (congestive) heart failure: Secondary | ICD-10-CM | POA: Diagnosis not present

## 2018-11-15 DIAGNOSIS — I130 Hypertensive heart and chronic kidney disease with heart failure and stage 1 through stage 4 chronic kidney disease, or unspecified chronic kidney disease: Secondary | ICD-10-CM | POA: Diagnosis not present

## 2018-11-15 DIAGNOSIS — Z7984 Long term (current) use of oral hypoglycemic drugs: Secondary | ICD-10-CM | POA: Diagnosis not present

## 2018-11-15 DIAGNOSIS — I872 Venous insufficiency (chronic) (peripheral): Secondary | ICD-10-CM | POA: Diagnosis not present

## 2018-11-15 DIAGNOSIS — J449 Chronic obstructive pulmonary disease, unspecified: Secondary | ICD-10-CM | POA: Diagnosis not present

## 2018-11-19 DIAGNOSIS — I2510 Atherosclerotic heart disease of native coronary artery without angina pectoris: Secondary | ICD-10-CM | POA: Diagnosis not present

## 2018-11-19 DIAGNOSIS — L97221 Non-pressure chronic ulcer of left calf limited to breakdown of skin: Secondary | ICD-10-CM | POA: Diagnosis not present

## 2018-11-19 DIAGNOSIS — E1122 Type 2 diabetes mellitus with diabetic chronic kidney disease: Secondary | ICD-10-CM | POA: Diagnosis not present

## 2018-11-19 DIAGNOSIS — Z930 Tracheostomy status: Secondary | ICD-10-CM | POA: Diagnosis not present

## 2018-11-19 DIAGNOSIS — J449 Chronic obstructive pulmonary disease, unspecified: Secondary | ICD-10-CM | POA: Diagnosis not present

## 2018-11-19 DIAGNOSIS — Z7984 Long term (current) use of oral hypoglycemic drugs: Secondary | ICD-10-CM | POA: Diagnosis not present

## 2018-11-19 DIAGNOSIS — I4891 Unspecified atrial fibrillation: Secondary | ICD-10-CM | POA: Diagnosis not present

## 2018-11-19 DIAGNOSIS — N182 Chronic kidney disease, stage 2 (mild): Secondary | ICD-10-CM | POA: Diagnosis not present

## 2018-11-19 DIAGNOSIS — I872 Venous insufficiency (chronic) (peripheral): Secondary | ICD-10-CM | POA: Diagnosis not present

## 2018-11-19 DIAGNOSIS — I130 Hypertensive heart and chronic kidney disease with heart failure and stage 1 through stage 4 chronic kidney disease, or unspecified chronic kidney disease: Secondary | ICD-10-CM | POA: Diagnosis not present

## 2018-11-19 DIAGNOSIS — I5030 Unspecified diastolic (congestive) heart failure: Secondary | ICD-10-CM | POA: Diagnosis not present

## 2018-11-19 DIAGNOSIS — E1140 Type 2 diabetes mellitus with diabetic neuropathy, unspecified: Secondary | ICD-10-CM | POA: Diagnosis not present

## 2018-11-20 DIAGNOSIS — Z930 Tracheostomy status: Secondary | ICD-10-CM | POA: Diagnosis not present

## 2018-11-20 DIAGNOSIS — I4891 Unspecified atrial fibrillation: Secondary | ICD-10-CM | POA: Diagnosis not present

## 2018-11-20 DIAGNOSIS — I872 Venous insufficiency (chronic) (peripheral): Secondary | ICD-10-CM | POA: Diagnosis not present

## 2018-11-20 DIAGNOSIS — I2510 Atherosclerotic heart disease of native coronary artery without angina pectoris: Secondary | ICD-10-CM | POA: Diagnosis not present

## 2018-11-20 DIAGNOSIS — N182 Chronic kidney disease, stage 2 (mild): Secondary | ICD-10-CM | POA: Diagnosis not present

## 2018-11-20 DIAGNOSIS — I5030 Unspecified diastolic (congestive) heart failure: Secondary | ICD-10-CM | POA: Diagnosis not present

## 2018-11-20 DIAGNOSIS — L97221 Non-pressure chronic ulcer of left calf limited to breakdown of skin: Secondary | ICD-10-CM | POA: Diagnosis not present

## 2018-11-20 DIAGNOSIS — Z7984 Long term (current) use of oral hypoglycemic drugs: Secondary | ICD-10-CM | POA: Diagnosis not present

## 2018-11-20 DIAGNOSIS — J449 Chronic obstructive pulmonary disease, unspecified: Secondary | ICD-10-CM | POA: Diagnosis not present

## 2018-11-20 DIAGNOSIS — E1122 Type 2 diabetes mellitus with diabetic chronic kidney disease: Secondary | ICD-10-CM | POA: Diagnosis not present

## 2018-11-20 DIAGNOSIS — E1140 Type 2 diabetes mellitus with diabetic neuropathy, unspecified: Secondary | ICD-10-CM | POA: Diagnosis not present

## 2018-11-20 DIAGNOSIS — I130 Hypertensive heart and chronic kidney disease with heart failure and stage 1 through stage 4 chronic kidney disease, or unspecified chronic kidney disease: Secondary | ICD-10-CM | POA: Diagnosis not present

## 2018-11-22 DIAGNOSIS — E1122 Type 2 diabetes mellitus with diabetic chronic kidney disease: Secondary | ICD-10-CM | POA: Diagnosis not present

## 2018-11-22 DIAGNOSIS — I130 Hypertensive heart and chronic kidney disease with heart failure and stage 1 through stage 4 chronic kidney disease, or unspecified chronic kidney disease: Secondary | ICD-10-CM | POA: Diagnosis not present

## 2018-11-22 DIAGNOSIS — Z930 Tracheostomy status: Secondary | ICD-10-CM | POA: Diagnosis not present

## 2018-11-22 DIAGNOSIS — E1140 Type 2 diabetes mellitus with diabetic neuropathy, unspecified: Secondary | ICD-10-CM | POA: Diagnosis not present

## 2018-11-22 DIAGNOSIS — J449 Chronic obstructive pulmonary disease, unspecified: Secondary | ICD-10-CM | POA: Diagnosis not present

## 2018-11-22 DIAGNOSIS — I872 Venous insufficiency (chronic) (peripheral): Secondary | ICD-10-CM | POA: Diagnosis not present

## 2018-11-22 DIAGNOSIS — I4891 Unspecified atrial fibrillation: Secondary | ICD-10-CM | POA: Diagnosis not present

## 2018-11-22 DIAGNOSIS — L97221 Non-pressure chronic ulcer of left calf limited to breakdown of skin: Secondary | ICD-10-CM | POA: Diagnosis not present

## 2018-11-22 DIAGNOSIS — I5030 Unspecified diastolic (congestive) heart failure: Secondary | ICD-10-CM | POA: Diagnosis not present

## 2018-11-22 DIAGNOSIS — I2510 Atherosclerotic heart disease of native coronary artery without angina pectoris: Secondary | ICD-10-CM | POA: Diagnosis not present

## 2018-11-22 DIAGNOSIS — N182 Chronic kidney disease, stage 2 (mild): Secondary | ICD-10-CM | POA: Diagnosis not present

## 2018-11-22 DIAGNOSIS — Z7984 Long term (current) use of oral hypoglycemic drugs: Secondary | ICD-10-CM | POA: Diagnosis not present

## 2018-11-26 DIAGNOSIS — E1122 Type 2 diabetes mellitus with diabetic chronic kidney disease: Secondary | ICD-10-CM | POA: Diagnosis not present

## 2018-11-26 DIAGNOSIS — Z7984 Long term (current) use of oral hypoglycemic drugs: Secondary | ICD-10-CM | POA: Diagnosis not present

## 2018-11-26 DIAGNOSIS — Z930 Tracheostomy status: Secondary | ICD-10-CM | POA: Diagnosis not present

## 2018-11-26 DIAGNOSIS — I872 Venous insufficiency (chronic) (peripheral): Secondary | ICD-10-CM | POA: Diagnosis not present

## 2018-11-26 DIAGNOSIS — L97221 Non-pressure chronic ulcer of left calf limited to breakdown of skin: Secondary | ICD-10-CM | POA: Diagnosis not present

## 2018-11-26 DIAGNOSIS — N182 Chronic kidney disease, stage 2 (mild): Secondary | ICD-10-CM | POA: Diagnosis not present

## 2018-11-26 DIAGNOSIS — I130 Hypertensive heart and chronic kidney disease with heart failure and stage 1 through stage 4 chronic kidney disease, or unspecified chronic kidney disease: Secondary | ICD-10-CM | POA: Diagnosis not present

## 2018-11-26 DIAGNOSIS — E1140 Type 2 diabetes mellitus with diabetic neuropathy, unspecified: Secondary | ICD-10-CM | POA: Diagnosis not present

## 2018-11-26 DIAGNOSIS — I5030 Unspecified diastolic (congestive) heart failure: Secondary | ICD-10-CM | POA: Diagnosis not present

## 2018-11-26 DIAGNOSIS — I2510 Atherosclerotic heart disease of native coronary artery without angina pectoris: Secondary | ICD-10-CM | POA: Diagnosis not present

## 2018-11-26 DIAGNOSIS — I4891 Unspecified atrial fibrillation: Secondary | ICD-10-CM | POA: Diagnosis not present

## 2018-11-26 DIAGNOSIS — J449 Chronic obstructive pulmonary disease, unspecified: Secondary | ICD-10-CM | POA: Diagnosis not present

## 2018-11-29 DIAGNOSIS — E1122 Type 2 diabetes mellitus with diabetic chronic kidney disease: Secondary | ICD-10-CM | POA: Diagnosis not present

## 2018-11-29 DIAGNOSIS — Z7984 Long term (current) use of oral hypoglycemic drugs: Secondary | ICD-10-CM | POA: Diagnosis not present

## 2018-11-29 DIAGNOSIS — I2510 Atherosclerotic heart disease of native coronary artery without angina pectoris: Secondary | ICD-10-CM | POA: Diagnosis not present

## 2018-11-29 DIAGNOSIS — L97221 Non-pressure chronic ulcer of left calf limited to breakdown of skin: Secondary | ICD-10-CM | POA: Diagnosis not present

## 2018-11-29 DIAGNOSIS — E1140 Type 2 diabetes mellitus with diabetic neuropathy, unspecified: Secondary | ICD-10-CM | POA: Diagnosis not present

## 2018-11-29 DIAGNOSIS — I4891 Unspecified atrial fibrillation: Secondary | ICD-10-CM | POA: Diagnosis not present

## 2018-11-29 DIAGNOSIS — I872 Venous insufficiency (chronic) (peripheral): Secondary | ICD-10-CM | POA: Diagnosis not present

## 2018-11-29 DIAGNOSIS — I5030 Unspecified diastolic (congestive) heart failure: Secondary | ICD-10-CM | POA: Diagnosis not present

## 2018-11-29 DIAGNOSIS — J449 Chronic obstructive pulmonary disease, unspecified: Secondary | ICD-10-CM | POA: Diagnosis not present

## 2018-11-29 DIAGNOSIS — N182 Chronic kidney disease, stage 2 (mild): Secondary | ICD-10-CM | POA: Diagnosis not present

## 2018-11-29 DIAGNOSIS — I130 Hypertensive heart and chronic kidney disease with heart failure and stage 1 through stage 4 chronic kidney disease, or unspecified chronic kidney disease: Secondary | ICD-10-CM | POA: Diagnosis not present

## 2018-11-29 DIAGNOSIS — Z930 Tracheostomy status: Secondary | ICD-10-CM | POA: Diagnosis not present

## 2018-12-03 DIAGNOSIS — N182 Chronic kidney disease, stage 2 (mild): Secondary | ICD-10-CM | POA: Diagnosis not present

## 2018-12-03 DIAGNOSIS — J449 Chronic obstructive pulmonary disease, unspecified: Secondary | ICD-10-CM | POA: Diagnosis not present

## 2018-12-03 DIAGNOSIS — E1140 Type 2 diabetes mellitus with diabetic neuropathy, unspecified: Secondary | ICD-10-CM | POA: Diagnosis not present

## 2018-12-03 DIAGNOSIS — Z7984 Long term (current) use of oral hypoglycemic drugs: Secondary | ICD-10-CM | POA: Diagnosis not present

## 2018-12-03 DIAGNOSIS — I130 Hypertensive heart and chronic kidney disease with heart failure and stage 1 through stage 4 chronic kidney disease, or unspecified chronic kidney disease: Secondary | ICD-10-CM | POA: Diagnosis not present

## 2018-12-03 DIAGNOSIS — I5030 Unspecified diastolic (congestive) heart failure: Secondary | ICD-10-CM | POA: Diagnosis not present

## 2018-12-03 DIAGNOSIS — I4891 Unspecified atrial fibrillation: Secondary | ICD-10-CM | POA: Diagnosis not present

## 2018-12-03 DIAGNOSIS — I2510 Atherosclerotic heart disease of native coronary artery without angina pectoris: Secondary | ICD-10-CM | POA: Diagnosis not present

## 2018-12-03 DIAGNOSIS — L97221 Non-pressure chronic ulcer of left calf limited to breakdown of skin: Secondary | ICD-10-CM | POA: Diagnosis not present

## 2018-12-03 DIAGNOSIS — E1122 Type 2 diabetes mellitus with diabetic chronic kidney disease: Secondary | ICD-10-CM | POA: Diagnosis not present

## 2018-12-03 DIAGNOSIS — Z930 Tracheostomy status: Secondary | ICD-10-CM | POA: Diagnosis not present

## 2018-12-03 DIAGNOSIS — I872 Venous insufficiency (chronic) (peripheral): Secondary | ICD-10-CM | POA: Diagnosis not present

## 2018-12-04 DIAGNOSIS — I4891 Unspecified atrial fibrillation: Secondary | ICD-10-CM | POA: Diagnosis not present

## 2018-12-04 DIAGNOSIS — I2510 Atherosclerotic heart disease of native coronary artery without angina pectoris: Secondary | ICD-10-CM | POA: Diagnosis not present

## 2018-12-04 DIAGNOSIS — L97221 Non-pressure chronic ulcer of left calf limited to breakdown of skin: Secondary | ICD-10-CM | POA: Diagnosis not present

## 2018-12-04 DIAGNOSIS — J449 Chronic obstructive pulmonary disease, unspecified: Secondary | ICD-10-CM | POA: Diagnosis not present

## 2018-12-04 DIAGNOSIS — E1122 Type 2 diabetes mellitus with diabetic chronic kidney disease: Secondary | ICD-10-CM | POA: Diagnosis not present

## 2018-12-04 DIAGNOSIS — E1140 Type 2 diabetes mellitus with diabetic neuropathy, unspecified: Secondary | ICD-10-CM | POA: Diagnosis not present

## 2018-12-04 DIAGNOSIS — Z7984 Long term (current) use of oral hypoglycemic drugs: Secondary | ICD-10-CM | POA: Diagnosis not present

## 2018-12-04 DIAGNOSIS — I5030 Unspecified diastolic (congestive) heart failure: Secondary | ICD-10-CM | POA: Diagnosis not present

## 2018-12-04 DIAGNOSIS — I130 Hypertensive heart and chronic kidney disease with heart failure and stage 1 through stage 4 chronic kidney disease, or unspecified chronic kidney disease: Secondary | ICD-10-CM | POA: Diagnosis not present

## 2018-12-04 DIAGNOSIS — Z930 Tracheostomy status: Secondary | ICD-10-CM | POA: Diagnosis not present

## 2018-12-04 DIAGNOSIS — N182 Chronic kidney disease, stage 2 (mild): Secondary | ICD-10-CM | POA: Diagnosis not present

## 2018-12-04 DIAGNOSIS — I872 Venous insufficiency (chronic) (peripheral): Secondary | ICD-10-CM | POA: Diagnosis not present

## 2018-12-06 DIAGNOSIS — N182 Chronic kidney disease, stage 2 (mild): Secondary | ICD-10-CM | POA: Diagnosis not present

## 2018-12-06 DIAGNOSIS — Z930 Tracheostomy status: Secondary | ICD-10-CM | POA: Diagnosis not present

## 2018-12-06 DIAGNOSIS — E1140 Type 2 diabetes mellitus with diabetic neuropathy, unspecified: Secondary | ICD-10-CM | POA: Diagnosis not present

## 2018-12-06 DIAGNOSIS — E1122 Type 2 diabetes mellitus with diabetic chronic kidney disease: Secondary | ICD-10-CM | POA: Diagnosis not present

## 2018-12-06 DIAGNOSIS — J449 Chronic obstructive pulmonary disease, unspecified: Secondary | ICD-10-CM | POA: Diagnosis not present

## 2018-12-06 DIAGNOSIS — I4891 Unspecified atrial fibrillation: Secondary | ICD-10-CM | POA: Diagnosis not present

## 2018-12-06 DIAGNOSIS — I2510 Atherosclerotic heart disease of native coronary artery without angina pectoris: Secondary | ICD-10-CM | POA: Diagnosis not present

## 2018-12-06 DIAGNOSIS — I872 Venous insufficiency (chronic) (peripheral): Secondary | ICD-10-CM | POA: Diagnosis not present

## 2018-12-06 DIAGNOSIS — L97221 Non-pressure chronic ulcer of left calf limited to breakdown of skin: Secondary | ICD-10-CM | POA: Diagnosis not present

## 2018-12-06 DIAGNOSIS — I130 Hypertensive heart and chronic kidney disease with heart failure and stage 1 through stage 4 chronic kidney disease, or unspecified chronic kidney disease: Secondary | ICD-10-CM | POA: Diagnosis not present

## 2018-12-06 DIAGNOSIS — I5030 Unspecified diastolic (congestive) heart failure: Secondary | ICD-10-CM | POA: Diagnosis not present

## 2018-12-06 DIAGNOSIS — Z7984 Long term (current) use of oral hypoglycemic drugs: Secondary | ICD-10-CM | POA: Diagnosis not present

## 2018-12-13 DIAGNOSIS — I130 Hypertensive heart and chronic kidney disease with heart failure and stage 1 through stage 4 chronic kidney disease, or unspecified chronic kidney disease: Secondary | ICD-10-CM | POA: Diagnosis not present

## 2018-12-13 DIAGNOSIS — Z930 Tracheostomy status: Secondary | ICD-10-CM | POA: Diagnosis not present

## 2018-12-13 DIAGNOSIS — N182 Chronic kidney disease, stage 2 (mild): Secondary | ICD-10-CM | POA: Diagnosis not present

## 2018-12-13 DIAGNOSIS — I5030 Unspecified diastolic (congestive) heart failure: Secondary | ICD-10-CM | POA: Diagnosis not present

## 2018-12-13 DIAGNOSIS — E1140 Type 2 diabetes mellitus with diabetic neuropathy, unspecified: Secondary | ICD-10-CM | POA: Diagnosis not present

## 2018-12-13 DIAGNOSIS — I2510 Atherosclerotic heart disease of native coronary artery without angina pectoris: Secondary | ICD-10-CM | POA: Diagnosis not present

## 2018-12-13 DIAGNOSIS — I872 Venous insufficiency (chronic) (peripheral): Secondary | ICD-10-CM | POA: Diagnosis not present

## 2018-12-13 DIAGNOSIS — Z7984 Long term (current) use of oral hypoglycemic drugs: Secondary | ICD-10-CM | POA: Diagnosis not present

## 2018-12-13 DIAGNOSIS — E1122 Type 2 diabetes mellitus with diabetic chronic kidney disease: Secondary | ICD-10-CM | POA: Diagnosis not present

## 2018-12-13 DIAGNOSIS — I4891 Unspecified atrial fibrillation: Secondary | ICD-10-CM | POA: Diagnosis not present

## 2018-12-13 DIAGNOSIS — L97221 Non-pressure chronic ulcer of left calf limited to breakdown of skin: Secondary | ICD-10-CM | POA: Diagnosis not present

## 2018-12-13 DIAGNOSIS — J449 Chronic obstructive pulmonary disease, unspecified: Secondary | ICD-10-CM | POA: Diagnosis not present

## 2018-12-17 DIAGNOSIS — J449 Chronic obstructive pulmonary disease, unspecified: Secondary | ICD-10-CM | POA: Diagnosis not present

## 2018-12-17 DIAGNOSIS — N182 Chronic kidney disease, stage 2 (mild): Secondary | ICD-10-CM | POA: Diagnosis not present

## 2018-12-17 DIAGNOSIS — Z930 Tracheostomy status: Secondary | ICD-10-CM | POA: Diagnosis not present

## 2018-12-17 DIAGNOSIS — I130 Hypertensive heart and chronic kidney disease with heart failure and stage 1 through stage 4 chronic kidney disease, or unspecified chronic kidney disease: Secondary | ICD-10-CM | POA: Diagnosis not present

## 2018-12-17 DIAGNOSIS — I872 Venous insufficiency (chronic) (peripheral): Secondary | ICD-10-CM | POA: Diagnosis not present

## 2018-12-17 DIAGNOSIS — I5030 Unspecified diastolic (congestive) heart failure: Secondary | ICD-10-CM | POA: Diagnosis not present

## 2018-12-17 DIAGNOSIS — I2510 Atherosclerotic heart disease of native coronary artery without angina pectoris: Secondary | ICD-10-CM | POA: Diagnosis not present

## 2018-12-17 DIAGNOSIS — I4891 Unspecified atrial fibrillation: Secondary | ICD-10-CM | POA: Diagnosis not present

## 2018-12-17 DIAGNOSIS — Z7984 Long term (current) use of oral hypoglycemic drugs: Secondary | ICD-10-CM | POA: Diagnosis not present

## 2018-12-17 DIAGNOSIS — E1140 Type 2 diabetes mellitus with diabetic neuropathy, unspecified: Secondary | ICD-10-CM | POA: Diagnosis not present

## 2018-12-17 DIAGNOSIS — L97221 Non-pressure chronic ulcer of left calf limited to breakdown of skin: Secondary | ICD-10-CM | POA: Diagnosis not present

## 2018-12-17 DIAGNOSIS — E1122 Type 2 diabetes mellitus with diabetic chronic kidney disease: Secondary | ICD-10-CM | POA: Diagnosis not present

## 2018-12-18 DIAGNOSIS — E1122 Type 2 diabetes mellitus with diabetic chronic kidney disease: Secondary | ICD-10-CM | POA: Diagnosis not present

## 2018-12-18 DIAGNOSIS — I5030 Unspecified diastolic (congestive) heart failure: Secondary | ICD-10-CM | POA: Diagnosis not present

## 2018-12-18 DIAGNOSIS — L97221 Non-pressure chronic ulcer of left calf limited to breakdown of skin: Secondary | ICD-10-CM | POA: Diagnosis not present

## 2018-12-18 DIAGNOSIS — J449 Chronic obstructive pulmonary disease, unspecified: Secondary | ICD-10-CM | POA: Diagnosis not present

## 2018-12-18 DIAGNOSIS — I4891 Unspecified atrial fibrillation: Secondary | ICD-10-CM | POA: Diagnosis not present

## 2018-12-18 DIAGNOSIS — N182 Chronic kidney disease, stage 2 (mild): Secondary | ICD-10-CM | POA: Diagnosis not present

## 2018-12-18 DIAGNOSIS — E1140 Type 2 diabetes mellitus with diabetic neuropathy, unspecified: Secondary | ICD-10-CM | POA: Diagnosis not present

## 2018-12-18 DIAGNOSIS — I130 Hypertensive heart and chronic kidney disease with heart failure and stage 1 through stage 4 chronic kidney disease, or unspecified chronic kidney disease: Secondary | ICD-10-CM | POA: Diagnosis not present

## 2018-12-18 DIAGNOSIS — I2510 Atherosclerotic heart disease of native coronary artery without angina pectoris: Secondary | ICD-10-CM | POA: Diagnosis not present

## 2018-12-18 DIAGNOSIS — I872 Venous insufficiency (chronic) (peripheral): Secondary | ICD-10-CM | POA: Diagnosis not present

## 2018-12-18 DIAGNOSIS — Z930 Tracheostomy status: Secondary | ICD-10-CM | POA: Diagnosis not present

## 2018-12-18 DIAGNOSIS — Z7984 Long term (current) use of oral hypoglycemic drugs: Secondary | ICD-10-CM | POA: Diagnosis not present

## 2018-12-20 DIAGNOSIS — I2510 Atherosclerotic heart disease of native coronary artery without angina pectoris: Secondary | ICD-10-CM | POA: Diagnosis not present

## 2018-12-20 DIAGNOSIS — N182 Chronic kidney disease, stage 2 (mild): Secondary | ICD-10-CM | POA: Diagnosis not present

## 2018-12-20 DIAGNOSIS — I4891 Unspecified atrial fibrillation: Secondary | ICD-10-CM | POA: Diagnosis not present

## 2018-12-20 DIAGNOSIS — I130 Hypertensive heart and chronic kidney disease with heart failure and stage 1 through stage 4 chronic kidney disease, or unspecified chronic kidney disease: Secondary | ICD-10-CM | POA: Diagnosis not present

## 2018-12-20 DIAGNOSIS — Z7984 Long term (current) use of oral hypoglycemic drugs: Secondary | ICD-10-CM | POA: Diagnosis not present

## 2018-12-20 DIAGNOSIS — I872 Venous insufficiency (chronic) (peripheral): Secondary | ICD-10-CM | POA: Diagnosis not present

## 2018-12-20 DIAGNOSIS — Z930 Tracheostomy status: Secondary | ICD-10-CM | POA: Diagnosis not present

## 2018-12-20 DIAGNOSIS — I5030 Unspecified diastolic (congestive) heart failure: Secondary | ICD-10-CM | POA: Diagnosis not present

## 2018-12-20 DIAGNOSIS — J449 Chronic obstructive pulmonary disease, unspecified: Secondary | ICD-10-CM | POA: Diagnosis not present

## 2018-12-20 DIAGNOSIS — E1140 Type 2 diabetes mellitus with diabetic neuropathy, unspecified: Secondary | ICD-10-CM | POA: Diagnosis not present

## 2018-12-20 DIAGNOSIS — L97221 Non-pressure chronic ulcer of left calf limited to breakdown of skin: Secondary | ICD-10-CM | POA: Diagnosis not present

## 2018-12-20 DIAGNOSIS — E1122 Type 2 diabetes mellitus with diabetic chronic kidney disease: Secondary | ICD-10-CM | POA: Diagnosis not present

## 2018-12-25 DIAGNOSIS — Z7984 Long term (current) use of oral hypoglycemic drugs: Secondary | ICD-10-CM | POA: Diagnosis not present

## 2018-12-25 DIAGNOSIS — Z930 Tracheostomy status: Secondary | ICD-10-CM | POA: Diagnosis not present

## 2018-12-25 DIAGNOSIS — N182 Chronic kidney disease, stage 2 (mild): Secondary | ICD-10-CM | POA: Diagnosis not present

## 2018-12-25 DIAGNOSIS — I4891 Unspecified atrial fibrillation: Secondary | ICD-10-CM | POA: Diagnosis not present

## 2018-12-25 DIAGNOSIS — L97221 Non-pressure chronic ulcer of left calf limited to breakdown of skin: Secondary | ICD-10-CM | POA: Diagnosis not present

## 2018-12-25 DIAGNOSIS — J449 Chronic obstructive pulmonary disease, unspecified: Secondary | ICD-10-CM | POA: Diagnosis not present

## 2018-12-25 DIAGNOSIS — I872 Venous insufficiency (chronic) (peripheral): Secondary | ICD-10-CM | POA: Diagnosis not present

## 2018-12-25 DIAGNOSIS — I130 Hypertensive heart and chronic kidney disease with heart failure and stage 1 through stage 4 chronic kidney disease, or unspecified chronic kidney disease: Secondary | ICD-10-CM | POA: Diagnosis not present

## 2018-12-25 DIAGNOSIS — E1140 Type 2 diabetes mellitus with diabetic neuropathy, unspecified: Secondary | ICD-10-CM | POA: Diagnosis not present

## 2018-12-25 DIAGNOSIS — E1122 Type 2 diabetes mellitus with diabetic chronic kidney disease: Secondary | ICD-10-CM | POA: Diagnosis not present

## 2018-12-25 DIAGNOSIS — I5030 Unspecified diastolic (congestive) heart failure: Secondary | ICD-10-CM | POA: Diagnosis not present

## 2018-12-25 DIAGNOSIS — I2510 Atherosclerotic heart disease of native coronary artery without angina pectoris: Secondary | ICD-10-CM | POA: Diagnosis not present

## 2018-12-27 DIAGNOSIS — I130 Hypertensive heart and chronic kidney disease with heart failure and stage 1 through stage 4 chronic kidney disease, or unspecified chronic kidney disease: Secondary | ICD-10-CM | POA: Diagnosis not present

## 2018-12-27 DIAGNOSIS — I4891 Unspecified atrial fibrillation: Secondary | ICD-10-CM | POA: Diagnosis not present

## 2018-12-27 DIAGNOSIS — Z7984 Long term (current) use of oral hypoglycemic drugs: Secondary | ICD-10-CM | POA: Diagnosis not present

## 2018-12-27 DIAGNOSIS — I5030 Unspecified diastolic (congestive) heart failure: Secondary | ICD-10-CM | POA: Diagnosis not present

## 2018-12-27 DIAGNOSIS — E1140 Type 2 diabetes mellitus with diabetic neuropathy, unspecified: Secondary | ICD-10-CM | POA: Diagnosis not present

## 2018-12-27 DIAGNOSIS — Z930 Tracheostomy status: Secondary | ICD-10-CM | POA: Diagnosis not present

## 2018-12-27 DIAGNOSIS — E1122 Type 2 diabetes mellitus with diabetic chronic kidney disease: Secondary | ICD-10-CM | POA: Diagnosis not present

## 2018-12-27 DIAGNOSIS — L97221 Non-pressure chronic ulcer of left calf limited to breakdown of skin: Secondary | ICD-10-CM | POA: Diagnosis not present

## 2018-12-27 DIAGNOSIS — I2510 Atherosclerotic heart disease of native coronary artery without angina pectoris: Secondary | ICD-10-CM | POA: Diagnosis not present

## 2018-12-27 DIAGNOSIS — J449 Chronic obstructive pulmonary disease, unspecified: Secondary | ICD-10-CM | POA: Diagnosis not present

## 2018-12-27 DIAGNOSIS — N182 Chronic kidney disease, stage 2 (mild): Secondary | ICD-10-CM | POA: Diagnosis not present

## 2018-12-27 DIAGNOSIS — I872 Venous insufficiency (chronic) (peripheral): Secondary | ICD-10-CM | POA: Diagnosis not present

## 2018-12-31 DIAGNOSIS — I2510 Atherosclerotic heart disease of native coronary artery without angina pectoris: Secondary | ICD-10-CM | POA: Diagnosis not present

## 2018-12-31 DIAGNOSIS — E1140 Type 2 diabetes mellitus with diabetic neuropathy, unspecified: Secondary | ICD-10-CM | POA: Diagnosis not present

## 2018-12-31 DIAGNOSIS — J449 Chronic obstructive pulmonary disease, unspecified: Secondary | ICD-10-CM | POA: Diagnosis not present

## 2018-12-31 DIAGNOSIS — Z930 Tracheostomy status: Secondary | ICD-10-CM | POA: Diagnosis not present

## 2018-12-31 DIAGNOSIS — L97221 Non-pressure chronic ulcer of left calf limited to breakdown of skin: Secondary | ICD-10-CM | POA: Diagnosis not present

## 2018-12-31 DIAGNOSIS — I130 Hypertensive heart and chronic kidney disease with heart failure and stage 1 through stage 4 chronic kidney disease, or unspecified chronic kidney disease: Secondary | ICD-10-CM | POA: Diagnosis not present

## 2018-12-31 DIAGNOSIS — I872 Venous insufficiency (chronic) (peripheral): Secondary | ICD-10-CM | POA: Diagnosis not present

## 2018-12-31 DIAGNOSIS — Z7984 Long term (current) use of oral hypoglycemic drugs: Secondary | ICD-10-CM | POA: Diagnosis not present

## 2018-12-31 DIAGNOSIS — I5030 Unspecified diastolic (congestive) heart failure: Secondary | ICD-10-CM | POA: Diagnosis not present

## 2018-12-31 DIAGNOSIS — N182 Chronic kidney disease, stage 2 (mild): Secondary | ICD-10-CM | POA: Diagnosis not present

## 2018-12-31 DIAGNOSIS — E1122 Type 2 diabetes mellitus with diabetic chronic kidney disease: Secondary | ICD-10-CM | POA: Diagnosis not present

## 2018-12-31 DIAGNOSIS — I4891 Unspecified atrial fibrillation: Secondary | ICD-10-CM | POA: Diagnosis not present

## 2019-01-03 DIAGNOSIS — I2510 Atherosclerotic heart disease of native coronary artery without angina pectoris: Secondary | ICD-10-CM | POA: Diagnosis not present

## 2019-01-03 DIAGNOSIS — I130 Hypertensive heart and chronic kidney disease with heart failure and stage 1 through stage 4 chronic kidney disease, or unspecified chronic kidney disease: Secondary | ICD-10-CM | POA: Diagnosis not present

## 2019-01-03 DIAGNOSIS — I5030 Unspecified diastolic (congestive) heart failure: Secondary | ICD-10-CM | POA: Diagnosis not present

## 2019-01-03 DIAGNOSIS — N182 Chronic kidney disease, stage 2 (mild): Secondary | ICD-10-CM | POA: Diagnosis not present

## 2019-01-03 DIAGNOSIS — E1140 Type 2 diabetes mellitus with diabetic neuropathy, unspecified: Secondary | ICD-10-CM | POA: Diagnosis not present

## 2019-01-03 DIAGNOSIS — L97221 Non-pressure chronic ulcer of left calf limited to breakdown of skin: Secondary | ICD-10-CM | POA: Diagnosis not present

## 2019-01-03 DIAGNOSIS — Z7984 Long term (current) use of oral hypoglycemic drugs: Secondary | ICD-10-CM | POA: Diagnosis not present

## 2019-01-03 DIAGNOSIS — I4891 Unspecified atrial fibrillation: Secondary | ICD-10-CM | POA: Diagnosis not present

## 2019-01-03 DIAGNOSIS — E1122 Type 2 diabetes mellitus with diabetic chronic kidney disease: Secondary | ICD-10-CM | POA: Diagnosis not present

## 2019-01-03 DIAGNOSIS — Z930 Tracheostomy status: Secondary | ICD-10-CM | POA: Diagnosis not present

## 2019-01-03 DIAGNOSIS — I872 Venous insufficiency (chronic) (peripheral): Secondary | ICD-10-CM | POA: Diagnosis not present

## 2019-01-03 DIAGNOSIS — J449 Chronic obstructive pulmonary disease, unspecified: Secondary | ICD-10-CM | POA: Diagnosis not present

## 2019-01-07 DIAGNOSIS — Z7984 Long term (current) use of oral hypoglycemic drugs: Secondary | ICD-10-CM | POA: Diagnosis not present

## 2019-01-07 DIAGNOSIS — I5030 Unspecified diastolic (congestive) heart failure: Secondary | ICD-10-CM | POA: Diagnosis not present

## 2019-01-07 DIAGNOSIS — E1122 Type 2 diabetes mellitus with diabetic chronic kidney disease: Secondary | ICD-10-CM | POA: Diagnosis not present

## 2019-01-07 DIAGNOSIS — Z930 Tracheostomy status: Secondary | ICD-10-CM | POA: Diagnosis not present

## 2019-01-07 DIAGNOSIS — J449 Chronic obstructive pulmonary disease, unspecified: Secondary | ICD-10-CM | POA: Diagnosis not present

## 2019-01-07 DIAGNOSIS — I4891 Unspecified atrial fibrillation: Secondary | ICD-10-CM | POA: Diagnosis not present

## 2019-01-07 DIAGNOSIS — L97221 Non-pressure chronic ulcer of left calf limited to breakdown of skin: Secondary | ICD-10-CM | POA: Diagnosis not present

## 2019-01-07 DIAGNOSIS — I2510 Atherosclerotic heart disease of native coronary artery without angina pectoris: Secondary | ICD-10-CM | POA: Diagnosis not present

## 2019-01-07 DIAGNOSIS — E1140 Type 2 diabetes mellitus with diabetic neuropathy, unspecified: Secondary | ICD-10-CM | POA: Diagnosis not present

## 2019-01-07 DIAGNOSIS — N182 Chronic kidney disease, stage 2 (mild): Secondary | ICD-10-CM | POA: Diagnosis not present

## 2019-01-07 DIAGNOSIS — I872 Venous insufficiency (chronic) (peripheral): Secondary | ICD-10-CM | POA: Diagnosis not present

## 2019-01-07 DIAGNOSIS — I130 Hypertensive heart and chronic kidney disease with heart failure and stage 1 through stage 4 chronic kidney disease, or unspecified chronic kidney disease: Secondary | ICD-10-CM | POA: Diagnosis not present

## 2019-01-08 DIAGNOSIS — I872 Venous insufficiency (chronic) (peripheral): Secondary | ICD-10-CM | POA: Diagnosis not present

## 2019-01-08 DIAGNOSIS — L97221 Non-pressure chronic ulcer of left calf limited to breakdown of skin: Secondary | ICD-10-CM | POA: Diagnosis not present

## 2019-01-08 DIAGNOSIS — E1122 Type 2 diabetes mellitus with diabetic chronic kidney disease: Secondary | ICD-10-CM | POA: Diagnosis not present

## 2019-01-08 DIAGNOSIS — I5030 Unspecified diastolic (congestive) heart failure: Secondary | ICD-10-CM | POA: Diagnosis not present

## 2019-01-08 DIAGNOSIS — Z930 Tracheostomy status: Secondary | ICD-10-CM | POA: Diagnosis not present

## 2019-01-08 DIAGNOSIS — J449 Chronic obstructive pulmonary disease, unspecified: Secondary | ICD-10-CM | POA: Diagnosis not present

## 2019-01-08 DIAGNOSIS — I2510 Atherosclerotic heart disease of native coronary artery without angina pectoris: Secondary | ICD-10-CM | POA: Diagnosis not present

## 2019-01-08 DIAGNOSIS — E1140 Type 2 diabetes mellitus with diabetic neuropathy, unspecified: Secondary | ICD-10-CM | POA: Diagnosis not present

## 2019-01-08 DIAGNOSIS — I130 Hypertensive heart and chronic kidney disease with heart failure and stage 1 through stage 4 chronic kidney disease, or unspecified chronic kidney disease: Secondary | ICD-10-CM | POA: Diagnosis not present

## 2019-01-08 DIAGNOSIS — I4891 Unspecified atrial fibrillation: Secondary | ICD-10-CM | POA: Diagnosis not present

## 2019-01-08 DIAGNOSIS — N182 Chronic kidney disease, stage 2 (mild): Secondary | ICD-10-CM | POA: Diagnosis not present

## 2019-01-08 DIAGNOSIS — Z7984 Long term (current) use of oral hypoglycemic drugs: Secondary | ICD-10-CM | POA: Diagnosis not present

## 2019-01-10 DIAGNOSIS — E1122 Type 2 diabetes mellitus with diabetic chronic kidney disease: Secondary | ICD-10-CM | POA: Diagnosis not present

## 2019-01-10 DIAGNOSIS — N182 Chronic kidney disease, stage 2 (mild): Secondary | ICD-10-CM | POA: Diagnosis not present

## 2019-01-10 DIAGNOSIS — I4891 Unspecified atrial fibrillation: Secondary | ICD-10-CM | POA: Diagnosis not present

## 2019-01-10 DIAGNOSIS — I2510 Atherosclerotic heart disease of native coronary artery without angina pectoris: Secondary | ICD-10-CM | POA: Diagnosis not present

## 2019-01-10 DIAGNOSIS — Z7984 Long term (current) use of oral hypoglycemic drugs: Secondary | ICD-10-CM | POA: Diagnosis not present

## 2019-01-10 DIAGNOSIS — J449 Chronic obstructive pulmonary disease, unspecified: Secondary | ICD-10-CM | POA: Diagnosis not present

## 2019-01-10 DIAGNOSIS — I872 Venous insufficiency (chronic) (peripheral): Secondary | ICD-10-CM | POA: Diagnosis not present

## 2019-01-10 DIAGNOSIS — I130 Hypertensive heart and chronic kidney disease with heart failure and stage 1 through stage 4 chronic kidney disease, or unspecified chronic kidney disease: Secondary | ICD-10-CM | POA: Diagnosis not present

## 2019-01-10 DIAGNOSIS — E1140 Type 2 diabetes mellitus with diabetic neuropathy, unspecified: Secondary | ICD-10-CM | POA: Diagnosis not present

## 2019-01-10 DIAGNOSIS — Z930 Tracheostomy status: Secondary | ICD-10-CM | POA: Diagnosis not present

## 2019-01-10 DIAGNOSIS — L97221 Non-pressure chronic ulcer of left calf limited to breakdown of skin: Secondary | ICD-10-CM | POA: Diagnosis not present

## 2019-01-10 DIAGNOSIS — I5030 Unspecified diastolic (congestive) heart failure: Secondary | ICD-10-CM | POA: Diagnosis not present

## 2019-01-15 DIAGNOSIS — E1122 Type 2 diabetes mellitus with diabetic chronic kidney disease: Secondary | ICD-10-CM | POA: Diagnosis not present

## 2019-01-15 DIAGNOSIS — I5030 Unspecified diastolic (congestive) heart failure: Secondary | ICD-10-CM | POA: Diagnosis not present

## 2019-01-15 DIAGNOSIS — Z930 Tracheostomy status: Secondary | ICD-10-CM | POA: Diagnosis not present

## 2019-01-15 DIAGNOSIS — I130 Hypertensive heart and chronic kidney disease with heart failure and stage 1 through stage 4 chronic kidney disease, or unspecified chronic kidney disease: Secondary | ICD-10-CM | POA: Diagnosis not present

## 2019-01-15 DIAGNOSIS — L97221 Non-pressure chronic ulcer of left calf limited to breakdown of skin: Secondary | ICD-10-CM | POA: Diagnosis not present

## 2019-01-15 DIAGNOSIS — Z7984 Long term (current) use of oral hypoglycemic drugs: Secondary | ICD-10-CM | POA: Diagnosis not present

## 2019-01-15 DIAGNOSIS — J449 Chronic obstructive pulmonary disease, unspecified: Secondary | ICD-10-CM | POA: Diagnosis not present

## 2019-01-15 DIAGNOSIS — I872 Venous insufficiency (chronic) (peripheral): Secondary | ICD-10-CM | POA: Diagnosis not present

## 2019-01-15 DIAGNOSIS — I2510 Atherosclerotic heart disease of native coronary artery without angina pectoris: Secondary | ICD-10-CM | POA: Diagnosis not present

## 2019-01-15 DIAGNOSIS — I4891 Unspecified atrial fibrillation: Secondary | ICD-10-CM | POA: Diagnosis not present

## 2019-01-15 DIAGNOSIS — E1140 Type 2 diabetes mellitus with diabetic neuropathy, unspecified: Secondary | ICD-10-CM | POA: Diagnosis not present

## 2019-01-15 DIAGNOSIS — N182 Chronic kidney disease, stage 2 (mild): Secondary | ICD-10-CM | POA: Diagnosis not present

## 2019-01-17 DIAGNOSIS — J449 Chronic obstructive pulmonary disease, unspecified: Secondary | ICD-10-CM | POA: Diagnosis not present

## 2019-01-17 DIAGNOSIS — I4891 Unspecified atrial fibrillation: Secondary | ICD-10-CM | POA: Diagnosis not present

## 2019-01-17 DIAGNOSIS — E1140 Type 2 diabetes mellitus with diabetic neuropathy, unspecified: Secondary | ICD-10-CM | POA: Diagnosis not present

## 2019-01-17 DIAGNOSIS — I5030 Unspecified diastolic (congestive) heart failure: Secondary | ICD-10-CM | POA: Diagnosis not present

## 2019-01-17 DIAGNOSIS — Z7984 Long term (current) use of oral hypoglycemic drugs: Secondary | ICD-10-CM | POA: Diagnosis not present

## 2019-01-17 DIAGNOSIS — I130 Hypertensive heart and chronic kidney disease with heart failure and stage 1 through stage 4 chronic kidney disease, or unspecified chronic kidney disease: Secondary | ICD-10-CM | POA: Diagnosis not present

## 2019-01-17 DIAGNOSIS — L97221 Non-pressure chronic ulcer of left calf limited to breakdown of skin: Secondary | ICD-10-CM | POA: Diagnosis not present

## 2019-01-17 DIAGNOSIS — I2510 Atherosclerotic heart disease of native coronary artery without angina pectoris: Secondary | ICD-10-CM | POA: Diagnosis not present

## 2019-01-17 DIAGNOSIS — N182 Chronic kidney disease, stage 2 (mild): Secondary | ICD-10-CM | POA: Diagnosis not present

## 2019-01-17 DIAGNOSIS — E1122 Type 2 diabetes mellitus with diabetic chronic kidney disease: Secondary | ICD-10-CM | POA: Diagnosis not present

## 2019-01-17 DIAGNOSIS — Z930 Tracheostomy status: Secondary | ICD-10-CM | POA: Diagnosis not present

## 2019-01-17 DIAGNOSIS — I872 Venous insufficiency (chronic) (peripheral): Secondary | ICD-10-CM | POA: Diagnosis not present

## 2019-01-18 DIAGNOSIS — E1122 Type 2 diabetes mellitus with diabetic chronic kidney disease: Secondary | ICD-10-CM | POA: Diagnosis not present

## 2019-01-18 DIAGNOSIS — I5030 Unspecified diastolic (congestive) heart failure: Secondary | ICD-10-CM | POA: Diagnosis not present

## 2019-01-18 DIAGNOSIS — I872 Venous insufficiency (chronic) (peripheral): Secondary | ICD-10-CM | POA: Diagnosis not present

## 2019-01-18 DIAGNOSIS — N182 Chronic kidney disease, stage 2 (mild): Secondary | ICD-10-CM | POA: Diagnosis not present

## 2019-01-18 DIAGNOSIS — I4891 Unspecified atrial fibrillation: Secondary | ICD-10-CM | POA: Diagnosis not present

## 2019-01-18 DIAGNOSIS — I130 Hypertensive heart and chronic kidney disease with heart failure and stage 1 through stage 4 chronic kidney disease, or unspecified chronic kidney disease: Secondary | ICD-10-CM | POA: Diagnosis not present

## 2019-01-18 DIAGNOSIS — Z930 Tracheostomy status: Secondary | ICD-10-CM | POA: Diagnosis not present

## 2019-01-18 DIAGNOSIS — J449 Chronic obstructive pulmonary disease, unspecified: Secondary | ICD-10-CM | POA: Diagnosis not present

## 2019-01-18 DIAGNOSIS — I2510 Atherosclerotic heart disease of native coronary artery without angina pectoris: Secondary | ICD-10-CM | POA: Diagnosis not present

## 2019-01-18 DIAGNOSIS — E1140 Type 2 diabetes mellitus with diabetic neuropathy, unspecified: Secondary | ICD-10-CM | POA: Diagnosis not present

## 2019-01-18 DIAGNOSIS — L97221 Non-pressure chronic ulcer of left calf limited to breakdown of skin: Secondary | ICD-10-CM | POA: Diagnosis not present

## 2019-01-18 DIAGNOSIS — Z7984 Long term (current) use of oral hypoglycemic drugs: Secondary | ICD-10-CM | POA: Diagnosis not present

## 2019-01-21 DIAGNOSIS — I872 Venous insufficiency (chronic) (peripheral): Secondary | ICD-10-CM | POA: Diagnosis not present

## 2019-01-21 DIAGNOSIS — E1140 Type 2 diabetes mellitus with diabetic neuropathy, unspecified: Secondary | ICD-10-CM | POA: Diagnosis not present

## 2019-01-21 DIAGNOSIS — I130 Hypertensive heart and chronic kidney disease with heart failure and stage 1 through stage 4 chronic kidney disease, or unspecified chronic kidney disease: Secondary | ICD-10-CM | POA: Diagnosis not present

## 2019-01-21 DIAGNOSIS — N182 Chronic kidney disease, stage 2 (mild): Secondary | ICD-10-CM | POA: Diagnosis not present

## 2019-01-21 DIAGNOSIS — I5030 Unspecified diastolic (congestive) heart failure: Secondary | ICD-10-CM | POA: Diagnosis not present

## 2019-01-21 DIAGNOSIS — E1122 Type 2 diabetes mellitus with diabetic chronic kidney disease: Secondary | ICD-10-CM | POA: Diagnosis not present

## 2019-01-21 DIAGNOSIS — L97221 Non-pressure chronic ulcer of left calf limited to breakdown of skin: Secondary | ICD-10-CM | POA: Diagnosis not present

## 2019-01-21 DIAGNOSIS — I4891 Unspecified atrial fibrillation: Secondary | ICD-10-CM | POA: Diagnosis not present

## 2019-01-21 DIAGNOSIS — Z7984 Long term (current) use of oral hypoglycemic drugs: Secondary | ICD-10-CM | POA: Diagnosis not present

## 2019-01-21 DIAGNOSIS — I2510 Atherosclerotic heart disease of native coronary artery without angina pectoris: Secondary | ICD-10-CM | POA: Diagnosis not present

## 2019-01-21 DIAGNOSIS — Z930 Tracheostomy status: Secondary | ICD-10-CM | POA: Diagnosis not present

## 2019-01-21 DIAGNOSIS — J449 Chronic obstructive pulmonary disease, unspecified: Secondary | ICD-10-CM | POA: Diagnosis not present

## 2019-01-22 DIAGNOSIS — E1140 Type 2 diabetes mellitus with diabetic neuropathy, unspecified: Secondary | ICD-10-CM | POA: Diagnosis not present

## 2019-01-22 DIAGNOSIS — I2510 Atherosclerotic heart disease of native coronary artery without angina pectoris: Secondary | ICD-10-CM | POA: Diagnosis not present

## 2019-01-22 DIAGNOSIS — E1122 Type 2 diabetes mellitus with diabetic chronic kidney disease: Secondary | ICD-10-CM | POA: Diagnosis not present

## 2019-01-22 DIAGNOSIS — L97221 Non-pressure chronic ulcer of left calf limited to breakdown of skin: Secondary | ICD-10-CM | POA: Diagnosis not present

## 2019-01-22 DIAGNOSIS — I4891 Unspecified atrial fibrillation: Secondary | ICD-10-CM | POA: Diagnosis not present

## 2019-01-22 DIAGNOSIS — I872 Venous insufficiency (chronic) (peripheral): Secondary | ICD-10-CM | POA: Diagnosis not present

## 2019-01-22 DIAGNOSIS — Z7984 Long term (current) use of oral hypoglycemic drugs: Secondary | ICD-10-CM | POA: Diagnosis not present

## 2019-01-22 DIAGNOSIS — J449 Chronic obstructive pulmonary disease, unspecified: Secondary | ICD-10-CM | POA: Diagnosis not present

## 2019-01-22 DIAGNOSIS — N182 Chronic kidney disease, stage 2 (mild): Secondary | ICD-10-CM | POA: Diagnosis not present

## 2019-01-22 DIAGNOSIS — I5030 Unspecified diastolic (congestive) heart failure: Secondary | ICD-10-CM | POA: Diagnosis not present

## 2019-01-22 DIAGNOSIS — I130 Hypertensive heart and chronic kidney disease with heart failure and stage 1 through stage 4 chronic kidney disease, or unspecified chronic kidney disease: Secondary | ICD-10-CM | POA: Diagnosis not present

## 2019-01-22 DIAGNOSIS — Z930 Tracheostomy status: Secondary | ICD-10-CM | POA: Diagnosis not present

## 2019-01-24 DIAGNOSIS — I2510 Atherosclerotic heart disease of native coronary artery without angina pectoris: Secondary | ICD-10-CM | POA: Diagnosis not present

## 2019-01-24 DIAGNOSIS — I5030 Unspecified diastolic (congestive) heart failure: Secondary | ICD-10-CM | POA: Diagnosis not present

## 2019-01-24 DIAGNOSIS — E1140 Type 2 diabetes mellitus with diabetic neuropathy, unspecified: Secondary | ICD-10-CM | POA: Diagnosis not present

## 2019-01-24 DIAGNOSIS — I130 Hypertensive heart and chronic kidney disease with heart failure and stage 1 through stage 4 chronic kidney disease, or unspecified chronic kidney disease: Secondary | ICD-10-CM | POA: Diagnosis not present

## 2019-01-24 DIAGNOSIS — Z930 Tracheostomy status: Secondary | ICD-10-CM | POA: Diagnosis not present

## 2019-01-24 DIAGNOSIS — J449 Chronic obstructive pulmonary disease, unspecified: Secondary | ICD-10-CM | POA: Diagnosis not present

## 2019-01-24 DIAGNOSIS — I4891 Unspecified atrial fibrillation: Secondary | ICD-10-CM | POA: Diagnosis not present

## 2019-01-24 DIAGNOSIS — L97221 Non-pressure chronic ulcer of left calf limited to breakdown of skin: Secondary | ICD-10-CM | POA: Diagnosis not present

## 2019-01-24 DIAGNOSIS — Z7984 Long term (current) use of oral hypoglycemic drugs: Secondary | ICD-10-CM | POA: Diagnosis not present

## 2019-01-24 DIAGNOSIS — N182 Chronic kidney disease, stage 2 (mild): Secondary | ICD-10-CM | POA: Diagnosis not present

## 2019-01-24 DIAGNOSIS — I872 Venous insufficiency (chronic) (peripheral): Secondary | ICD-10-CM | POA: Diagnosis not present

## 2019-01-24 DIAGNOSIS — E1122 Type 2 diabetes mellitus with diabetic chronic kidney disease: Secondary | ICD-10-CM | POA: Diagnosis not present

## 2019-01-28 DIAGNOSIS — L97221 Non-pressure chronic ulcer of left calf limited to breakdown of skin: Secondary | ICD-10-CM | POA: Diagnosis not present

## 2019-01-28 DIAGNOSIS — J449 Chronic obstructive pulmonary disease, unspecified: Secondary | ICD-10-CM | POA: Diagnosis not present

## 2019-01-28 DIAGNOSIS — E1140 Type 2 diabetes mellitus with diabetic neuropathy, unspecified: Secondary | ICD-10-CM | POA: Diagnosis not present

## 2019-01-28 DIAGNOSIS — I5030 Unspecified diastolic (congestive) heart failure: Secondary | ICD-10-CM | POA: Diagnosis not present

## 2019-01-28 DIAGNOSIS — I2510 Atherosclerotic heart disease of native coronary artery without angina pectoris: Secondary | ICD-10-CM | POA: Diagnosis not present

## 2019-01-28 DIAGNOSIS — E1122 Type 2 diabetes mellitus with diabetic chronic kidney disease: Secondary | ICD-10-CM | POA: Diagnosis not present

## 2019-01-28 DIAGNOSIS — I872 Venous insufficiency (chronic) (peripheral): Secondary | ICD-10-CM | POA: Diagnosis not present

## 2019-01-28 DIAGNOSIS — I4891 Unspecified atrial fibrillation: Secondary | ICD-10-CM | POA: Diagnosis not present

## 2019-01-28 DIAGNOSIS — Z930 Tracheostomy status: Secondary | ICD-10-CM | POA: Diagnosis not present

## 2019-01-28 DIAGNOSIS — N182 Chronic kidney disease, stage 2 (mild): Secondary | ICD-10-CM | POA: Diagnosis not present

## 2019-01-28 DIAGNOSIS — Z7984 Long term (current) use of oral hypoglycemic drugs: Secondary | ICD-10-CM | POA: Diagnosis not present

## 2019-01-28 DIAGNOSIS — I130 Hypertensive heart and chronic kidney disease with heart failure and stage 1 through stage 4 chronic kidney disease, or unspecified chronic kidney disease: Secondary | ICD-10-CM | POA: Diagnosis not present

## 2019-01-29 DIAGNOSIS — I5030 Unspecified diastolic (congestive) heart failure: Secondary | ICD-10-CM | POA: Diagnosis not present

## 2019-01-29 DIAGNOSIS — E1122 Type 2 diabetes mellitus with diabetic chronic kidney disease: Secondary | ICD-10-CM | POA: Diagnosis not present

## 2019-01-29 DIAGNOSIS — I872 Venous insufficiency (chronic) (peripheral): Secondary | ICD-10-CM | POA: Diagnosis not present

## 2019-01-29 DIAGNOSIS — E1140 Type 2 diabetes mellitus with diabetic neuropathy, unspecified: Secondary | ICD-10-CM | POA: Diagnosis not present

## 2019-01-29 DIAGNOSIS — I2510 Atherosclerotic heart disease of native coronary artery without angina pectoris: Secondary | ICD-10-CM | POA: Diagnosis not present

## 2019-01-29 DIAGNOSIS — I4891 Unspecified atrial fibrillation: Secondary | ICD-10-CM | POA: Diagnosis not present

## 2019-01-29 DIAGNOSIS — Z930 Tracheostomy status: Secondary | ICD-10-CM | POA: Diagnosis not present

## 2019-01-29 DIAGNOSIS — J449 Chronic obstructive pulmonary disease, unspecified: Secondary | ICD-10-CM | POA: Diagnosis not present

## 2019-01-29 DIAGNOSIS — I130 Hypertensive heart and chronic kidney disease with heart failure and stage 1 through stage 4 chronic kidney disease, or unspecified chronic kidney disease: Secondary | ICD-10-CM | POA: Diagnosis not present

## 2019-01-29 DIAGNOSIS — N182 Chronic kidney disease, stage 2 (mild): Secondary | ICD-10-CM | POA: Diagnosis not present

## 2019-01-29 DIAGNOSIS — Z7984 Long term (current) use of oral hypoglycemic drugs: Secondary | ICD-10-CM | POA: Diagnosis not present

## 2019-01-29 DIAGNOSIS — L97221 Non-pressure chronic ulcer of left calf limited to breakdown of skin: Secondary | ICD-10-CM | POA: Diagnosis not present

## 2019-02-01 DIAGNOSIS — I872 Venous insufficiency (chronic) (peripheral): Secondary | ICD-10-CM | POA: Diagnosis not present

## 2019-02-01 DIAGNOSIS — I2510 Atherosclerotic heart disease of native coronary artery without angina pectoris: Secondary | ICD-10-CM | POA: Diagnosis not present

## 2019-02-01 DIAGNOSIS — J449 Chronic obstructive pulmonary disease, unspecified: Secondary | ICD-10-CM | POA: Diagnosis not present

## 2019-02-01 DIAGNOSIS — Z930 Tracheostomy status: Secondary | ICD-10-CM | POA: Diagnosis not present

## 2019-02-01 DIAGNOSIS — E1122 Type 2 diabetes mellitus with diabetic chronic kidney disease: Secondary | ICD-10-CM | POA: Diagnosis not present

## 2019-02-01 DIAGNOSIS — I5030 Unspecified diastolic (congestive) heart failure: Secondary | ICD-10-CM | POA: Diagnosis not present

## 2019-02-01 DIAGNOSIS — I130 Hypertensive heart and chronic kidney disease with heart failure and stage 1 through stage 4 chronic kidney disease, or unspecified chronic kidney disease: Secondary | ICD-10-CM | POA: Diagnosis not present

## 2019-02-01 DIAGNOSIS — N182 Chronic kidney disease, stage 2 (mild): Secondary | ICD-10-CM | POA: Diagnosis not present

## 2019-02-01 DIAGNOSIS — L97221 Non-pressure chronic ulcer of left calf limited to breakdown of skin: Secondary | ICD-10-CM | POA: Diagnosis not present

## 2019-02-01 DIAGNOSIS — Z7984 Long term (current) use of oral hypoglycemic drugs: Secondary | ICD-10-CM | POA: Diagnosis not present

## 2019-02-01 DIAGNOSIS — I4891 Unspecified atrial fibrillation: Secondary | ICD-10-CM | POA: Diagnosis not present

## 2019-02-01 DIAGNOSIS — E1140 Type 2 diabetes mellitus with diabetic neuropathy, unspecified: Secondary | ICD-10-CM | POA: Diagnosis not present

## 2019-02-05 DIAGNOSIS — E1122 Type 2 diabetes mellitus with diabetic chronic kidney disease: Secondary | ICD-10-CM | POA: Diagnosis not present

## 2019-02-05 DIAGNOSIS — J449 Chronic obstructive pulmonary disease, unspecified: Secondary | ICD-10-CM | POA: Diagnosis not present

## 2019-02-05 DIAGNOSIS — E1140 Type 2 diabetes mellitus with diabetic neuropathy, unspecified: Secondary | ICD-10-CM | POA: Diagnosis not present

## 2019-02-05 DIAGNOSIS — I5030 Unspecified diastolic (congestive) heart failure: Secondary | ICD-10-CM | POA: Diagnosis not present

## 2019-02-05 DIAGNOSIS — N182 Chronic kidney disease, stage 2 (mild): Secondary | ICD-10-CM | POA: Diagnosis not present

## 2019-02-05 DIAGNOSIS — Z7984 Long term (current) use of oral hypoglycemic drugs: Secondary | ICD-10-CM | POA: Diagnosis not present

## 2019-02-05 DIAGNOSIS — L97221 Non-pressure chronic ulcer of left calf limited to breakdown of skin: Secondary | ICD-10-CM | POA: Diagnosis not present

## 2019-02-05 DIAGNOSIS — I130 Hypertensive heart and chronic kidney disease with heart failure and stage 1 through stage 4 chronic kidney disease, or unspecified chronic kidney disease: Secondary | ICD-10-CM | POA: Diagnosis not present

## 2019-02-05 DIAGNOSIS — Z930 Tracheostomy status: Secondary | ICD-10-CM | POA: Diagnosis not present

## 2019-02-05 DIAGNOSIS — I2510 Atherosclerotic heart disease of native coronary artery without angina pectoris: Secondary | ICD-10-CM | POA: Diagnosis not present

## 2019-02-05 DIAGNOSIS — I872 Venous insufficiency (chronic) (peripheral): Secondary | ICD-10-CM | POA: Diagnosis not present

## 2019-02-05 DIAGNOSIS — I4891 Unspecified atrial fibrillation: Secondary | ICD-10-CM | POA: Diagnosis not present

## 2019-02-06 DIAGNOSIS — J449 Chronic obstructive pulmonary disease, unspecified: Secondary | ICD-10-CM | POA: Diagnosis not present

## 2019-02-06 DIAGNOSIS — I4891 Unspecified atrial fibrillation: Secondary | ICD-10-CM | POA: Diagnosis not present

## 2019-02-06 DIAGNOSIS — E1140 Type 2 diabetes mellitus with diabetic neuropathy, unspecified: Secondary | ICD-10-CM | POA: Diagnosis not present

## 2019-02-06 DIAGNOSIS — E1122 Type 2 diabetes mellitus with diabetic chronic kidney disease: Secondary | ICD-10-CM | POA: Diagnosis not present

## 2019-02-06 DIAGNOSIS — I872 Venous insufficiency (chronic) (peripheral): Secondary | ICD-10-CM | POA: Diagnosis not present

## 2019-02-06 DIAGNOSIS — Z930 Tracheostomy status: Secondary | ICD-10-CM | POA: Diagnosis not present

## 2019-02-06 DIAGNOSIS — I5030 Unspecified diastolic (congestive) heart failure: Secondary | ICD-10-CM | POA: Diagnosis not present

## 2019-02-06 DIAGNOSIS — I2510 Atherosclerotic heart disease of native coronary artery without angina pectoris: Secondary | ICD-10-CM | POA: Diagnosis not present

## 2019-02-06 DIAGNOSIS — I130 Hypertensive heart and chronic kidney disease with heart failure and stage 1 through stage 4 chronic kidney disease, or unspecified chronic kidney disease: Secondary | ICD-10-CM | POA: Diagnosis not present

## 2019-02-06 DIAGNOSIS — Z7984 Long term (current) use of oral hypoglycemic drugs: Secondary | ICD-10-CM | POA: Diagnosis not present

## 2019-02-06 DIAGNOSIS — L97221 Non-pressure chronic ulcer of left calf limited to breakdown of skin: Secondary | ICD-10-CM | POA: Diagnosis not present

## 2019-02-06 DIAGNOSIS — N182 Chronic kidney disease, stage 2 (mild): Secondary | ICD-10-CM | POA: Diagnosis not present

## 2019-02-07 DIAGNOSIS — I5030 Unspecified diastolic (congestive) heart failure: Secondary | ICD-10-CM | POA: Diagnosis not present

## 2019-02-07 DIAGNOSIS — Z930 Tracheostomy status: Secondary | ICD-10-CM | POA: Diagnosis not present

## 2019-02-07 DIAGNOSIS — I4891 Unspecified atrial fibrillation: Secondary | ICD-10-CM | POA: Diagnosis not present

## 2019-02-07 DIAGNOSIS — E1140 Type 2 diabetes mellitus with diabetic neuropathy, unspecified: Secondary | ICD-10-CM | POA: Diagnosis not present

## 2019-02-07 DIAGNOSIS — I130 Hypertensive heart and chronic kidney disease with heart failure and stage 1 through stage 4 chronic kidney disease, or unspecified chronic kidney disease: Secondary | ICD-10-CM | POA: Diagnosis not present

## 2019-02-07 DIAGNOSIS — I2510 Atherosclerotic heart disease of native coronary artery without angina pectoris: Secondary | ICD-10-CM | POA: Diagnosis not present

## 2019-02-07 DIAGNOSIS — L97221 Non-pressure chronic ulcer of left calf limited to breakdown of skin: Secondary | ICD-10-CM | POA: Diagnosis not present

## 2019-02-07 DIAGNOSIS — Z7984 Long term (current) use of oral hypoglycemic drugs: Secondary | ICD-10-CM | POA: Diagnosis not present

## 2019-02-07 DIAGNOSIS — I872 Venous insufficiency (chronic) (peripheral): Secondary | ICD-10-CM | POA: Diagnosis not present

## 2019-02-07 DIAGNOSIS — N182 Chronic kidney disease, stage 2 (mild): Secondary | ICD-10-CM | POA: Diagnosis not present

## 2019-02-07 DIAGNOSIS — J449 Chronic obstructive pulmonary disease, unspecified: Secondary | ICD-10-CM | POA: Diagnosis not present

## 2019-02-07 DIAGNOSIS — E1122 Type 2 diabetes mellitus with diabetic chronic kidney disease: Secondary | ICD-10-CM | POA: Diagnosis not present

## 2019-02-11 DIAGNOSIS — Z930 Tracheostomy status: Secondary | ICD-10-CM | POA: Diagnosis not present

## 2019-02-11 DIAGNOSIS — L97221 Non-pressure chronic ulcer of left calf limited to breakdown of skin: Secondary | ICD-10-CM | POA: Diagnosis not present

## 2019-02-11 DIAGNOSIS — I872 Venous insufficiency (chronic) (peripheral): Secondary | ICD-10-CM | POA: Diagnosis not present

## 2019-02-11 DIAGNOSIS — I5030 Unspecified diastolic (congestive) heart failure: Secondary | ICD-10-CM | POA: Diagnosis not present

## 2019-02-11 DIAGNOSIS — I2510 Atherosclerotic heart disease of native coronary artery without angina pectoris: Secondary | ICD-10-CM | POA: Diagnosis not present

## 2019-02-11 DIAGNOSIS — E1122 Type 2 diabetes mellitus with diabetic chronic kidney disease: Secondary | ICD-10-CM | POA: Diagnosis not present

## 2019-02-11 DIAGNOSIS — N182 Chronic kidney disease, stage 2 (mild): Secondary | ICD-10-CM | POA: Diagnosis not present

## 2019-02-11 DIAGNOSIS — Z7984 Long term (current) use of oral hypoglycemic drugs: Secondary | ICD-10-CM | POA: Diagnosis not present

## 2019-02-11 DIAGNOSIS — I130 Hypertensive heart and chronic kidney disease with heart failure and stage 1 through stage 4 chronic kidney disease, or unspecified chronic kidney disease: Secondary | ICD-10-CM | POA: Diagnosis not present

## 2019-02-11 DIAGNOSIS — I4891 Unspecified atrial fibrillation: Secondary | ICD-10-CM | POA: Diagnosis not present

## 2019-02-11 DIAGNOSIS — J449 Chronic obstructive pulmonary disease, unspecified: Secondary | ICD-10-CM | POA: Diagnosis not present

## 2019-02-11 DIAGNOSIS — E1140 Type 2 diabetes mellitus with diabetic neuropathy, unspecified: Secondary | ICD-10-CM | POA: Diagnosis not present

## 2019-02-12 DIAGNOSIS — I2510 Atherosclerotic heart disease of native coronary artery without angina pectoris: Secondary | ICD-10-CM | POA: Diagnosis not present

## 2019-02-12 DIAGNOSIS — E1122 Type 2 diabetes mellitus with diabetic chronic kidney disease: Secondary | ICD-10-CM | POA: Diagnosis not present

## 2019-02-12 DIAGNOSIS — I130 Hypertensive heart and chronic kidney disease with heart failure and stage 1 through stage 4 chronic kidney disease, or unspecified chronic kidney disease: Secondary | ICD-10-CM | POA: Diagnosis not present

## 2019-02-12 DIAGNOSIS — J449 Chronic obstructive pulmonary disease, unspecified: Secondary | ICD-10-CM | POA: Diagnosis not present

## 2019-02-12 DIAGNOSIS — Z930 Tracheostomy status: Secondary | ICD-10-CM | POA: Diagnosis not present

## 2019-02-12 DIAGNOSIS — L97221 Non-pressure chronic ulcer of left calf limited to breakdown of skin: Secondary | ICD-10-CM | POA: Diagnosis not present

## 2019-02-12 DIAGNOSIS — I872 Venous insufficiency (chronic) (peripheral): Secondary | ICD-10-CM | POA: Diagnosis not present

## 2019-02-12 DIAGNOSIS — E1140 Type 2 diabetes mellitus with diabetic neuropathy, unspecified: Secondary | ICD-10-CM | POA: Diagnosis not present

## 2019-02-12 DIAGNOSIS — I5030 Unspecified diastolic (congestive) heart failure: Secondary | ICD-10-CM | POA: Diagnosis not present

## 2019-02-12 DIAGNOSIS — I4891 Unspecified atrial fibrillation: Secondary | ICD-10-CM | POA: Diagnosis not present

## 2019-02-12 DIAGNOSIS — N182 Chronic kidney disease, stage 2 (mild): Secondary | ICD-10-CM | POA: Diagnosis not present

## 2019-02-12 DIAGNOSIS — Z7984 Long term (current) use of oral hypoglycemic drugs: Secondary | ICD-10-CM | POA: Diagnosis not present

## 2019-02-14 DIAGNOSIS — E1140 Type 2 diabetes mellitus with diabetic neuropathy, unspecified: Secondary | ICD-10-CM | POA: Diagnosis not present

## 2019-02-14 DIAGNOSIS — I130 Hypertensive heart and chronic kidney disease with heart failure and stage 1 through stage 4 chronic kidney disease, or unspecified chronic kidney disease: Secondary | ICD-10-CM | POA: Diagnosis not present

## 2019-02-14 DIAGNOSIS — Z7984 Long term (current) use of oral hypoglycemic drugs: Secondary | ICD-10-CM | POA: Diagnosis not present

## 2019-02-14 DIAGNOSIS — N182 Chronic kidney disease, stage 2 (mild): Secondary | ICD-10-CM | POA: Diagnosis not present

## 2019-02-14 DIAGNOSIS — J449 Chronic obstructive pulmonary disease, unspecified: Secondary | ICD-10-CM | POA: Diagnosis not present

## 2019-02-14 DIAGNOSIS — I872 Venous insufficiency (chronic) (peripheral): Secondary | ICD-10-CM | POA: Diagnosis not present

## 2019-02-14 DIAGNOSIS — I4891 Unspecified atrial fibrillation: Secondary | ICD-10-CM | POA: Diagnosis not present

## 2019-02-14 DIAGNOSIS — I2510 Atherosclerotic heart disease of native coronary artery without angina pectoris: Secondary | ICD-10-CM | POA: Diagnosis not present

## 2019-02-14 DIAGNOSIS — E1122 Type 2 diabetes mellitus with diabetic chronic kidney disease: Secondary | ICD-10-CM | POA: Diagnosis not present

## 2019-02-14 DIAGNOSIS — Z930 Tracheostomy status: Secondary | ICD-10-CM | POA: Diagnosis not present

## 2019-02-14 DIAGNOSIS — L97221 Non-pressure chronic ulcer of left calf limited to breakdown of skin: Secondary | ICD-10-CM | POA: Diagnosis not present

## 2019-02-14 DIAGNOSIS — I5030 Unspecified diastolic (congestive) heart failure: Secondary | ICD-10-CM | POA: Diagnosis not present

## 2019-02-18 DIAGNOSIS — Z930 Tracheostomy status: Secondary | ICD-10-CM | POA: Diagnosis not present

## 2019-02-18 DIAGNOSIS — Z7984 Long term (current) use of oral hypoglycemic drugs: Secondary | ICD-10-CM | POA: Diagnosis not present

## 2019-02-18 DIAGNOSIS — N182 Chronic kidney disease, stage 2 (mild): Secondary | ICD-10-CM | POA: Diagnosis not present

## 2019-02-18 DIAGNOSIS — I4891 Unspecified atrial fibrillation: Secondary | ICD-10-CM | POA: Diagnosis not present

## 2019-02-18 DIAGNOSIS — I130 Hypertensive heart and chronic kidney disease with heart failure and stage 1 through stage 4 chronic kidney disease, or unspecified chronic kidney disease: Secondary | ICD-10-CM | POA: Diagnosis not present

## 2019-02-18 DIAGNOSIS — I5030 Unspecified diastolic (congestive) heart failure: Secondary | ICD-10-CM | POA: Diagnosis not present

## 2019-02-18 DIAGNOSIS — J449 Chronic obstructive pulmonary disease, unspecified: Secondary | ICD-10-CM | POA: Diagnosis not present

## 2019-02-18 DIAGNOSIS — I2510 Atherosclerotic heart disease of native coronary artery without angina pectoris: Secondary | ICD-10-CM | POA: Diagnosis not present

## 2019-02-18 DIAGNOSIS — E1122 Type 2 diabetes mellitus with diabetic chronic kidney disease: Secondary | ICD-10-CM | POA: Diagnosis not present

## 2019-02-18 DIAGNOSIS — L97221 Non-pressure chronic ulcer of left calf limited to breakdown of skin: Secondary | ICD-10-CM | POA: Diagnosis not present

## 2019-02-18 DIAGNOSIS — E1140 Type 2 diabetes mellitus with diabetic neuropathy, unspecified: Secondary | ICD-10-CM | POA: Diagnosis not present

## 2019-02-18 DIAGNOSIS — I872 Venous insufficiency (chronic) (peripheral): Secondary | ICD-10-CM | POA: Diagnosis not present

## 2019-02-19 DIAGNOSIS — L97221 Non-pressure chronic ulcer of left calf limited to breakdown of skin: Secondary | ICD-10-CM | POA: Diagnosis not present

## 2019-02-19 DIAGNOSIS — E1140 Type 2 diabetes mellitus with diabetic neuropathy, unspecified: Secondary | ICD-10-CM | POA: Diagnosis not present

## 2019-02-19 DIAGNOSIS — J449 Chronic obstructive pulmonary disease, unspecified: Secondary | ICD-10-CM | POA: Diagnosis not present

## 2019-02-19 DIAGNOSIS — I4891 Unspecified atrial fibrillation: Secondary | ICD-10-CM | POA: Diagnosis not present

## 2019-02-19 DIAGNOSIS — I872 Venous insufficiency (chronic) (peripheral): Secondary | ICD-10-CM | POA: Diagnosis not present

## 2019-02-19 DIAGNOSIS — Z7984 Long term (current) use of oral hypoglycemic drugs: Secondary | ICD-10-CM | POA: Diagnosis not present

## 2019-02-19 DIAGNOSIS — N182 Chronic kidney disease, stage 2 (mild): Secondary | ICD-10-CM | POA: Diagnosis not present

## 2019-02-19 DIAGNOSIS — Z930 Tracheostomy status: Secondary | ICD-10-CM | POA: Diagnosis not present

## 2019-02-19 DIAGNOSIS — I130 Hypertensive heart and chronic kidney disease with heart failure and stage 1 through stage 4 chronic kidney disease, or unspecified chronic kidney disease: Secondary | ICD-10-CM | POA: Diagnosis not present

## 2019-02-19 DIAGNOSIS — I2510 Atherosclerotic heart disease of native coronary artery without angina pectoris: Secondary | ICD-10-CM | POA: Diagnosis not present

## 2019-02-19 DIAGNOSIS — E1122 Type 2 diabetes mellitus with diabetic chronic kidney disease: Secondary | ICD-10-CM | POA: Diagnosis not present

## 2019-02-19 DIAGNOSIS — I5030 Unspecified diastolic (congestive) heart failure: Secondary | ICD-10-CM | POA: Diagnosis not present

## 2019-02-21 DIAGNOSIS — I4891 Unspecified atrial fibrillation: Secondary | ICD-10-CM | POA: Diagnosis not present

## 2019-02-21 DIAGNOSIS — L97221 Non-pressure chronic ulcer of left calf limited to breakdown of skin: Secondary | ICD-10-CM | POA: Diagnosis not present

## 2019-02-21 DIAGNOSIS — Z930 Tracheostomy status: Secondary | ICD-10-CM | POA: Diagnosis not present

## 2019-02-21 DIAGNOSIS — I872 Venous insufficiency (chronic) (peripheral): Secondary | ICD-10-CM | POA: Diagnosis not present

## 2019-02-21 DIAGNOSIS — E1122 Type 2 diabetes mellitus with diabetic chronic kidney disease: Secondary | ICD-10-CM | POA: Diagnosis not present

## 2019-02-21 DIAGNOSIS — I5030 Unspecified diastolic (congestive) heart failure: Secondary | ICD-10-CM | POA: Diagnosis not present

## 2019-02-21 DIAGNOSIS — E1140 Type 2 diabetes mellitus with diabetic neuropathy, unspecified: Secondary | ICD-10-CM | POA: Diagnosis not present

## 2019-02-21 DIAGNOSIS — I130 Hypertensive heart and chronic kidney disease with heart failure and stage 1 through stage 4 chronic kidney disease, or unspecified chronic kidney disease: Secondary | ICD-10-CM | POA: Diagnosis not present

## 2019-02-21 DIAGNOSIS — I2510 Atherosclerotic heart disease of native coronary artery without angina pectoris: Secondary | ICD-10-CM | POA: Diagnosis not present

## 2019-02-21 DIAGNOSIS — Z7984 Long term (current) use of oral hypoglycemic drugs: Secondary | ICD-10-CM | POA: Diagnosis not present

## 2019-02-21 DIAGNOSIS — N182 Chronic kidney disease, stage 2 (mild): Secondary | ICD-10-CM | POA: Diagnosis not present

## 2019-02-21 DIAGNOSIS — J449 Chronic obstructive pulmonary disease, unspecified: Secondary | ICD-10-CM | POA: Diagnosis not present

## 2019-02-25 DIAGNOSIS — I5030 Unspecified diastolic (congestive) heart failure: Secondary | ICD-10-CM | POA: Diagnosis not present

## 2019-02-25 DIAGNOSIS — I4891 Unspecified atrial fibrillation: Secondary | ICD-10-CM | POA: Diagnosis not present

## 2019-02-25 DIAGNOSIS — Z930 Tracheostomy status: Secondary | ICD-10-CM | POA: Diagnosis not present

## 2019-02-25 DIAGNOSIS — N182 Chronic kidney disease, stage 2 (mild): Secondary | ICD-10-CM | POA: Diagnosis not present

## 2019-02-25 DIAGNOSIS — Z7984 Long term (current) use of oral hypoglycemic drugs: Secondary | ICD-10-CM | POA: Diagnosis not present

## 2019-02-25 DIAGNOSIS — I2510 Atherosclerotic heart disease of native coronary artery without angina pectoris: Secondary | ICD-10-CM | POA: Diagnosis not present

## 2019-02-25 DIAGNOSIS — E1140 Type 2 diabetes mellitus with diabetic neuropathy, unspecified: Secondary | ICD-10-CM | POA: Diagnosis not present

## 2019-02-25 DIAGNOSIS — E1122 Type 2 diabetes mellitus with diabetic chronic kidney disease: Secondary | ICD-10-CM | POA: Diagnosis not present

## 2019-02-25 DIAGNOSIS — I872 Venous insufficiency (chronic) (peripheral): Secondary | ICD-10-CM | POA: Diagnosis not present

## 2019-02-25 DIAGNOSIS — J449 Chronic obstructive pulmonary disease, unspecified: Secondary | ICD-10-CM | POA: Diagnosis not present

## 2019-02-25 DIAGNOSIS — I130 Hypertensive heart and chronic kidney disease with heart failure and stage 1 through stage 4 chronic kidney disease, or unspecified chronic kidney disease: Secondary | ICD-10-CM | POA: Diagnosis not present

## 2019-02-25 DIAGNOSIS — L97221 Non-pressure chronic ulcer of left calf limited to breakdown of skin: Secondary | ICD-10-CM | POA: Diagnosis not present

## 2019-02-27 DIAGNOSIS — J449 Chronic obstructive pulmonary disease, unspecified: Secondary | ICD-10-CM | POA: Diagnosis not present

## 2019-02-27 DIAGNOSIS — I5030 Unspecified diastolic (congestive) heart failure: Secondary | ICD-10-CM | POA: Diagnosis not present

## 2019-02-27 DIAGNOSIS — E1140 Type 2 diabetes mellitus with diabetic neuropathy, unspecified: Secondary | ICD-10-CM | POA: Diagnosis not present

## 2019-02-27 DIAGNOSIS — L97221 Non-pressure chronic ulcer of left calf limited to breakdown of skin: Secondary | ICD-10-CM | POA: Diagnosis not present

## 2019-02-27 DIAGNOSIS — E1122 Type 2 diabetes mellitus with diabetic chronic kidney disease: Secondary | ICD-10-CM | POA: Diagnosis not present

## 2019-02-27 DIAGNOSIS — I2510 Atherosclerotic heart disease of native coronary artery without angina pectoris: Secondary | ICD-10-CM | POA: Diagnosis not present

## 2019-02-27 DIAGNOSIS — I4891 Unspecified atrial fibrillation: Secondary | ICD-10-CM | POA: Diagnosis not present

## 2019-02-27 DIAGNOSIS — I130 Hypertensive heart and chronic kidney disease with heart failure and stage 1 through stage 4 chronic kidney disease, or unspecified chronic kidney disease: Secondary | ICD-10-CM | POA: Diagnosis not present

## 2019-02-27 DIAGNOSIS — Z930 Tracheostomy status: Secondary | ICD-10-CM | POA: Diagnosis not present

## 2019-02-27 DIAGNOSIS — N182 Chronic kidney disease, stage 2 (mild): Secondary | ICD-10-CM | POA: Diagnosis not present

## 2019-02-27 DIAGNOSIS — I872 Venous insufficiency (chronic) (peripheral): Secondary | ICD-10-CM | POA: Diagnosis not present

## 2019-02-27 DIAGNOSIS — Z7984 Long term (current) use of oral hypoglycemic drugs: Secondary | ICD-10-CM | POA: Diagnosis not present

## 2019-03-04 DIAGNOSIS — L97221 Non-pressure chronic ulcer of left calf limited to breakdown of skin: Secondary | ICD-10-CM | POA: Diagnosis not present

## 2019-03-04 DIAGNOSIS — Z930 Tracheostomy status: Secondary | ICD-10-CM | POA: Diagnosis not present

## 2019-03-04 DIAGNOSIS — E1122 Type 2 diabetes mellitus with diabetic chronic kidney disease: Secondary | ICD-10-CM | POA: Diagnosis not present

## 2019-03-04 DIAGNOSIS — E1140 Type 2 diabetes mellitus with diabetic neuropathy, unspecified: Secondary | ICD-10-CM | POA: Diagnosis not present

## 2019-03-04 DIAGNOSIS — I130 Hypertensive heart and chronic kidney disease with heart failure and stage 1 through stage 4 chronic kidney disease, or unspecified chronic kidney disease: Secondary | ICD-10-CM | POA: Diagnosis not present

## 2019-03-04 DIAGNOSIS — I872 Venous insufficiency (chronic) (peripheral): Secondary | ICD-10-CM | POA: Diagnosis not present

## 2019-03-04 DIAGNOSIS — N182 Chronic kidney disease, stage 2 (mild): Secondary | ICD-10-CM | POA: Diagnosis not present

## 2019-03-04 DIAGNOSIS — J449 Chronic obstructive pulmonary disease, unspecified: Secondary | ICD-10-CM | POA: Diagnosis not present

## 2019-03-04 DIAGNOSIS — I4891 Unspecified atrial fibrillation: Secondary | ICD-10-CM | POA: Diagnosis not present

## 2019-03-04 DIAGNOSIS — I2510 Atherosclerotic heart disease of native coronary artery without angina pectoris: Secondary | ICD-10-CM | POA: Diagnosis not present

## 2019-03-04 DIAGNOSIS — I5030 Unspecified diastolic (congestive) heart failure: Secondary | ICD-10-CM | POA: Diagnosis not present

## 2019-03-04 DIAGNOSIS — Z7984 Long term (current) use of oral hypoglycemic drugs: Secondary | ICD-10-CM | POA: Diagnosis not present

## 2019-03-07 DIAGNOSIS — E1122 Type 2 diabetes mellitus with diabetic chronic kidney disease: Secondary | ICD-10-CM | POA: Diagnosis not present

## 2019-03-07 DIAGNOSIS — I130 Hypertensive heart and chronic kidney disease with heart failure and stage 1 through stage 4 chronic kidney disease, or unspecified chronic kidney disease: Secondary | ICD-10-CM | POA: Diagnosis not present

## 2019-03-07 DIAGNOSIS — L97911 Non-pressure chronic ulcer of unspecified part of right lower leg limited to breakdown of skin: Secondary | ICD-10-CM | POA: Diagnosis not present

## 2019-03-07 DIAGNOSIS — I2510 Atherosclerotic heart disease of native coronary artery without angina pectoris: Secondary | ICD-10-CM | POA: Diagnosis not present

## 2019-03-07 DIAGNOSIS — L97221 Non-pressure chronic ulcer of left calf limited to breakdown of skin: Secondary | ICD-10-CM | POA: Diagnosis not present

## 2019-03-07 DIAGNOSIS — N182 Chronic kidney disease, stage 2 (mild): Secondary | ICD-10-CM | POA: Diagnosis not present

## 2019-03-07 DIAGNOSIS — I4891 Unspecified atrial fibrillation: Secondary | ICD-10-CM | POA: Diagnosis not present

## 2019-03-07 DIAGNOSIS — Z930 Tracheostomy status: Secondary | ICD-10-CM | POA: Diagnosis not present

## 2019-03-07 DIAGNOSIS — J449 Chronic obstructive pulmonary disease, unspecified: Secondary | ICD-10-CM | POA: Diagnosis not present

## 2019-03-07 DIAGNOSIS — I872 Venous insufficiency (chronic) (peripheral): Secondary | ICD-10-CM | POA: Diagnosis not present

## 2019-03-07 DIAGNOSIS — I5030 Unspecified diastolic (congestive) heart failure: Secondary | ICD-10-CM | POA: Diagnosis not present

## 2019-03-11 DIAGNOSIS — Z930 Tracheostomy status: Secondary | ICD-10-CM | POA: Diagnosis not present

## 2019-03-11 DIAGNOSIS — E1122 Type 2 diabetes mellitus with diabetic chronic kidney disease: Secondary | ICD-10-CM | POA: Diagnosis not present

## 2019-03-11 DIAGNOSIS — I2510 Atherosclerotic heart disease of native coronary artery without angina pectoris: Secondary | ICD-10-CM | POA: Diagnosis not present

## 2019-03-11 DIAGNOSIS — L97911 Non-pressure chronic ulcer of unspecified part of right lower leg limited to breakdown of skin: Secondary | ICD-10-CM | POA: Diagnosis not present

## 2019-03-11 DIAGNOSIS — N182 Chronic kidney disease, stage 2 (mild): Secondary | ICD-10-CM | POA: Diagnosis not present

## 2019-03-11 DIAGNOSIS — I4891 Unspecified atrial fibrillation: Secondary | ICD-10-CM | POA: Diagnosis not present

## 2019-03-11 DIAGNOSIS — I130 Hypertensive heart and chronic kidney disease with heart failure and stage 1 through stage 4 chronic kidney disease, or unspecified chronic kidney disease: Secondary | ICD-10-CM | POA: Diagnosis not present

## 2019-03-11 DIAGNOSIS — J449 Chronic obstructive pulmonary disease, unspecified: Secondary | ICD-10-CM | POA: Diagnosis not present

## 2019-03-11 DIAGNOSIS — I5030 Unspecified diastolic (congestive) heart failure: Secondary | ICD-10-CM | POA: Diagnosis not present

## 2019-03-11 DIAGNOSIS — L97221 Non-pressure chronic ulcer of left calf limited to breakdown of skin: Secondary | ICD-10-CM | POA: Diagnosis not present

## 2019-03-11 DIAGNOSIS — I872 Venous insufficiency (chronic) (peripheral): Secondary | ICD-10-CM | POA: Diagnosis not present

## 2019-03-13 DIAGNOSIS — I5030 Unspecified diastolic (congestive) heart failure: Secondary | ICD-10-CM | POA: Diagnosis not present

## 2019-03-13 DIAGNOSIS — I4891 Unspecified atrial fibrillation: Secondary | ICD-10-CM | POA: Diagnosis not present

## 2019-03-13 DIAGNOSIS — E1122 Type 2 diabetes mellitus with diabetic chronic kidney disease: Secondary | ICD-10-CM | POA: Diagnosis not present

## 2019-03-13 DIAGNOSIS — I2510 Atherosclerotic heart disease of native coronary artery without angina pectoris: Secondary | ICD-10-CM | POA: Diagnosis not present

## 2019-03-13 DIAGNOSIS — J449 Chronic obstructive pulmonary disease, unspecified: Secondary | ICD-10-CM | POA: Diagnosis not present

## 2019-03-13 DIAGNOSIS — N182 Chronic kidney disease, stage 2 (mild): Secondary | ICD-10-CM | POA: Diagnosis not present

## 2019-03-13 DIAGNOSIS — L97221 Non-pressure chronic ulcer of left calf limited to breakdown of skin: Secondary | ICD-10-CM | POA: Diagnosis not present

## 2019-03-13 DIAGNOSIS — I130 Hypertensive heart and chronic kidney disease with heart failure and stage 1 through stage 4 chronic kidney disease, or unspecified chronic kidney disease: Secondary | ICD-10-CM | POA: Diagnosis not present

## 2019-03-13 DIAGNOSIS — L97911 Non-pressure chronic ulcer of unspecified part of right lower leg limited to breakdown of skin: Secondary | ICD-10-CM | POA: Diagnosis not present

## 2019-03-13 DIAGNOSIS — Z930 Tracheostomy status: Secondary | ICD-10-CM | POA: Diagnosis not present

## 2019-03-13 DIAGNOSIS — I872 Venous insufficiency (chronic) (peripheral): Secondary | ICD-10-CM | POA: Diagnosis not present

## 2019-03-15 DIAGNOSIS — E1122 Type 2 diabetes mellitus with diabetic chronic kidney disease: Secondary | ICD-10-CM | POA: Diagnosis not present

## 2019-03-15 DIAGNOSIS — I4891 Unspecified atrial fibrillation: Secondary | ICD-10-CM | POA: Diagnosis not present

## 2019-03-15 DIAGNOSIS — I872 Venous insufficiency (chronic) (peripheral): Secondary | ICD-10-CM | POA: Diagnosis not present

## 2019-03-15 DIAGNOSIS — L97911 Non-pressure chronic ulcer of unspecified part of right lower leg limited to breakdown of skin: Secondary | ICD-10-CM | POA: Diagnosis not present

## 2019-03-15 DIAGNOSIS — L97221 Non-pressure chronic ulcer of left calf limited to breakdown of skin: Secondary | ICD-10-CM | POA: Diagnosis not present

## 2019-03-15 DIAGNOSIS — I2510 Atherosclerotic heart disease of native coronary artery without angina pectoris: Secondary | ICD-10-CM | POA: Diagnosis not present

## 2019-03-15 DIAGNOSIS — J449 Chronic obstructive pulmonary disease, unspecified: Secondary | ICD-10-CM | POA: Diagnosis not present

## 2019-03-15 DIAGNOSIS — N182 Chronic kidney disease, stage 2 (mild): Secondary | ICD-10-CM | POA: Diagnosis not present

## 2019-03-15 DIAGNOSIS — Z930 Tracheostomy status: Secondary | ICD-10-CM | POA: Diagnosis not present

## 2019-03-15 DIAGNOSIS — I5030 Unspecified diastolic (congestive) heart failure: Secondary | ICD-10-CM | POA: Diagnosis not present

## 2019-03-15 DIAGNOSIS — I130 Hypertensive heart and chronic kidney disease with heart failure and stage 1 through stage 4 chronic kidney disease, or unspecified chronic kidney disease: Secondary | ICD-10-CM | POA: Diagnosis not present

## 2019-03-18 DIAGNOSIS — I130 Hypertensive heart and chronic kidney disease with heart failure and stage 1 through stage 4 chronic kidney disease, or unspecified chronic kidney disease: Secondary | ICD-10-CM | POA: Diagnosis not present

## 2019-03-18 DIAGNOSIS — I872 Venous insufficiency (chronic) (peripheral): Secondary | ICD-10-CM | POA: Diagnosis not present

## 2019-03-18 DIAGNOSIS — I5030 Unspecified diastolic (congestive) heart failure: Secondary | ICD-10-CM | POA: Diagnosis not present

## 2019-03-18 DIAGNOSIS — Z930 Tracheostomy status: Secondary | ICD-10-CM | POA: Diagnosis not present

## 2019-03-18 DIAGNOSIS — I2510 Atherosclerotic heart disease of native coronary artery without angina pectoris: Secondary | ICD-10-CM | POA: Diagnosis not present

## 2019-03-18 DIAGNOSIS — N182 Chronic kidney disease, stage 2 (mild): Secondary | ICD-10-CM | POA: Diagnosis not present

## 2019-03-18 DIAGNOSIS — E1122 Type 2 diabetes mellitus with diabetic chronic kidney disease: Secondary | ICD-10-CM | POA: Diagnosis not present

## 2019-03-18 DIAGNOSIS — L97221 Non-pressure chronic ulcer of left calf limited to breakdown of skin: Secondary | ICD-10-CM | POA: Diagnosis not present

## 2019-03-18 DIAGNOSIS — J449 Chronic obstructive pulmonary disease, unspecified: Secondary | ICD-10-CM | POA: Diagnosis not present

## 2019-03-18 DIAGNOSIS — L97911 Non-pressure chronic ulcer of unspecified part of right lower leg limited to breakdown of skin: Secondary | ICD-10-CM | POA: Diagnosis not present

## 2019-03-18 DIAGNOSIS — I4891 Unspecified atrial fibrillation: Secondary | ICD-10-CM | POA: Diagnosis not present

## 2019-03-20 DIAGNOSIS — I872 Venous insufficiency (chronic) (peripheral): Secondary | ICD-10-CM | POA: Diagnosis not present

## 2019-03-20 DIAGNOSIS — Z930 Tracheostomy status: Secondary | ICD-10-CM | POA: Diagnosis not present

## 2019-03-20 DIAGNOSIS — L97221 Non-pressure chronic ulcer of left calf limited to breakdown of skin: Secondary | ICD-10-CM | POA: Diagnosis not present

## 2019-03-20 DIAGNOSIS — E1122 Type 2 diabetes mellitus with diabetic chronic kidney disease: Secondary | ICD-10-CM | POA: Diagnosis not present

## 2019-03-20 DIAGNOSIS — N182 Chronic kidney disease, stage 2 (mild): Secondary | ICD-10-CM | POA: Diagnosis not present

## 2019-03-20 DIAGNOSIS — L97911 Non-pressure chronic ulcer of unspecified part of right lower leg limited to breakdown of skin: Secondary | ICD-10-CM | POA: Diagnosis not present

## 2019-03-20 DIAGNOSIS — I5030 Unspecified diastolic (congestive) heart failure: Secondary | ICD-10-CM | POA: Diagnosis not present

## 2019-03-20 DIAGNOSIS — J449 Chronic obstructive pulmonary disease, unspecified: Secondary | ICD-10-CM | POA: Diagnosis not present

## 2019-03-20 DIAGNOSIS — I2510 Atherosclerotic heart disease of native coronary artery without angina pectoris: Secondary | ICD-10-CM | POA: Diagnosis not present

## 2019-03-20 DIAGNOSIS — I4891 Unspecified atrial fibrillation: Secondary | ICD-10-CM | POA: Diagnosis not present

## 2019-03-20 DIAGNOSIS — I130 Hypertensive heart and chronic kidney disease with heart failure and stage 1 through stage 4 chronic kidney disease, or unspecified chronic kidney disease: Secondary | ICD-10-CM | POA: Diagnosis not present

## 2019-03-22 DIAGNOSIS — I2510 Atherosclerotic heart disease of native coronary artery without angina pectoris: Secondary | ICD-10-CM | POA: Diagnosis not present

## 2019-03-22 DIAGNOSIS — I130 Hypertensive heart and chronic kidney disease with heart failure and stage 1 through stage 4 chronic kidney disease, or unspecified chronic kidney disease: Secondary | ICD-10-CM | POA: Diagnosis not present

## 2019-03-22 DIAGNOSIS — L97221 Non-pressure chronic ulcer of left calf limited to breakdown of skin: Secondary | ICD-10-CM | POA: Diagnosis not present

## 2019-03-22 DIAGNOSIS — I5030 Unspecified diastolic (congestive) heart failure: Secondary | ICD-10-CM | POA: Diagnosis not present

## 2019-03-22 DIAGNOSIS — N182 Chronic kidney disease, stage 2 (mild): Secondary | ICD-10-CM | POA: Diagnosis not present

## 2019-03-22 DIAGNOSIS — I872 Venous insufficiency (chronic) (peripheral): Secondary | ICD-10-CM | POA: Diagnosis not present

## 2019-03-22 DIAGNOSIS — L97911 Non-pressure chronic ulcer of unspecified part of right lower leg limited to breakdown of skin: Secondary | ICD-10-CM | POA: Diagnosis not present

## 2019-03-22 DIAGNOSIS — J449 Chronic obstructive pulmonary disease, unspecified: Secondary | ICD-10-CM | POA: Diagnosis not present

## 2019-03-22 DIAGNOSIS — E1122 Type 2 diabetes mellitus with diabetic chronic kidney disease: Secondary | ICD-10-CM | POA: Diagnosis not present

## 2019-03-22 DIAGNOSIS — I4891 Unspecified atrial fibrillation: Secondary | ICD-10-CM | POA: Diagnosis not present

## 2019-03-22 DIAGNOSIS — Z930 Tracheostomy status: Secondary | ICD-10-CM | POA: Diagnosis not present

## 2019-03-25 DIAGNOSIS — J449 Chronic obstructive pulmonary disease, unspecified: Secondary | ICD-10-CM | POA: Diagnosis not present

## 2019-03-25 DIAGNOSIS — L97221 Non-pressure chronic ulcer of left calf limited to breakdown of skin: Secondary | ICD-10-CM | POA: Diagnosis not present

## 2019-03-25 DIAGNOSIS — I4891 Unspecified atrial fibrillation: Secondary | ICD-10-CM | POA: Diagnosis not present

## 2019-03-25 DIAGNOSIS — N182 Chronic kidney disease, stage 2 (mild): Secondary | ICD-10-CM | POA: Diagnosis not present

## 2019-03-25 DIAGNOSIS — E1122 Type 2 diabetes mellitus with diabetic chronic kidney disease: Secondary | ICD-10-CM | POA: Diagnosis not present

## 2019-03-25 DIAGNOSIS — I130 Hypertensive heart and chronic kidney disease with heart failure and stage 1 through stage 4 chronic kidney disease, or unspecified chronic kidney disease: Secondary | ICD-10-CM | POA: Diagnosis not present

## 2019-03-25 DIAGNOSIS — I5030 Unspecified diastolic (congestive) heart failure: Secondary | ICD-10-CM | POA: Diagnosis not present

## 2019-03-25 DIAGNOSIS — L97911 Non-pressure chronic ulcer of unspecified part of right lower leg limited to breakdown of skin: Secondary | ICD-10-CM | POA: Diagnosis not present

## 2019-03-25 DIAGNOSIS — I872 Venous insufficiency (chronic) (peripheral): Secondary | ICD-10-CM | POA: Diagnosis not present

## 2019-03-25 DIAGNOSIS — Z930 Tracheostomy status: Secondary | ICD-10-CM | POA: Diagnosis not present

## 2019-03-25 DIAGNOSIS — I2510 Atherosclerotic heart disease of native coronary artery without angina pectoris: Secondary | ICD-10-CM | POA: Diagnosis not present

## 2019-03-27 ENCOUNTER — Emergency Department: Payer: Medicare Other

## 2019-03-27 ENCOUNTER — Encounter: Payer: Self-pay | Admitting: Emergency Medicine

## 2019-03-27 ENCOUNTER — Inpatient Hospital Stay
Admission: EM | Admit: 2019-03-27 | Discharge: 2019-04-03 | DRG: 640 | Disposition: A | Discharge status: Skilled Nursing Facility | Payer: Medicare Other | Attending: Internal Medicine | Admitting: Internal Medicine

## 2019-03-27 ENCOUNTER — Other Ambulatory Visit: Payer: Self-pay

## 2019-03-27 DIAGNOSIS — Z8711 Personal history of peptic ulcer disease: Secondary | ICD-10-CM | POA: Diagnosis not present

## 2019-03-27 DIAGNOSIS — E785 Hyperlipidemia, unspecified: Secondary | ICD-10-CM | POA: Diagnosis present

## 2019-03-27 DIAGNOSIS — Z7401 Bed confinement status: Secondary | ICD-10-CM | POA: Diagnosis not present

## 2019-03-27 DIAGNOSIS — Z8673 Personal history of transient ischemic attack (TIA), and cerebral infarction without residual deficits: Secondary | ICD-10-CM

## 2019-03-27 DIAGNOSIS — M2550 Pain in unspecified joint: Secondary | ICD-10-CM | POA: Diagnosis not present

## 2019-03-27 DIAGNOSIS — Z23 Encounter for immunization: Secondary | ICD-10-CM | POA: Diagnosis not present

## 2019-03-27 DIAGNOSIS — I4510 Unspecified right bundle-branch block: Secondary | ICD-10-CM | POA: Diagnosis present

## 2019-03-27 DIAGNOSIS — Z930 Tracheostomy status: Secondary | ICD-10-CM | POA: Diagnosis not present

## 2019-03-27 DIAGNOSIS — E875 Hyperkalemia: Principal | ICD-10-CM | POA: Diagnosis present

## 2019-03-27 DIAGNOSIS — I6389 Other cerebral infarction: Secondary | ICD-10-CM | POA: Diagnosis not present

## 2019-03-27 DIAGNOSIS — W19XXXA Unspecified fall, initial encounter: Secondary | ICD-10-CM | POA: Diagnosis present

## 2019-03-27 DIAGNOSIS — L97819 Non-pressure chronic ulcer of other part of right lower leg with unspecified severity: Secondary | ICD-10-CM | POA: Diagnosis present

## 2019-03-27 DIAGNOSIS — I10 Essential (primary) hypertension: Secondary | ICD-10-CM | POA: Diagnosis not present

## 2019-03-27 DIAGNOSIS — E611 Iron deficiency: Secondary | ICD-10-CM | POA: Diagnosis not present

## 2019-03-27 DIAGNOSIS — N179 Acute kidney failure, unspecified: Secondary | ICD-10-CM | POA: Diagnosis present

## 2019-03-27 DIAGNOSIS — S0101XA Laceration without foreign body of scalp, initial encounter: Secondary | ICD-10-CM

## 2019-03-27 DIAGNOSIS — M109 Gout, unspecified: Secondary | ICD-10-CM | POA: Diagnosis present

## 2019-03-27 DIAGNOSIS — Z7901 Long term (current) use of anticoagulants: Secondary | ICD-10-CM

## 2019-03-27 DIAGNOSIS — Z20822 Contact with and (suspected) exposure to covid-19: Secondary | ICD-10-CM | POA: Diagnosis present

## 2019-03-27 DIAGNOSIS — Z95810 Presence of automatic (implantable) cardiac defibrillator: Secondary | ICD-10-CM

## 2019-03-27 DIAGNOSIS — R4020 Unspecified coma: Secondary | ICD-10-CM | POA: Diagnosis not present

## 2019-03-27 DIAGNOSIS — E1122 Type 2 diabetes mellitus with diabetic chronic kidney disease: Secondary | ICD-10-CM | POA: Diagnosis present

## 2019-03-27 DIAGNOSIS — I959 Hypotension, unspecified: Secondary | ICD-10-CM | POA: Diagnosis not present

## 2019-03-27 DIAGNOSIS — I130 Hypertensive heart and chronic kidney disease with heart failure and stage 1 through stage 4 chronic kidney disease, or unspecified chronic kidney disease: Secondary | ICD-10-CM | POA: Diagnosis present

## 2019-03-27 DIAGNOSIS — R0603 Acute respiratory distress: Secondary | ICD-10-CM | POA: Diagnosis not present

## 2019-03-27 DIAGNOSIS — R531 Weakness: Secondary | ICD-10-CM | POA: Diagnosis not present

## 2019-03-27 DIAGNOSIS — Z8249 Family history of ischemic heart disease and other diseases of the circulatory system: Secondary | ICD-10-CM

## 2019-03-27 DIAGNOSIS — I639 Cerebral infarction, unspecified: Secondary | ICD-10-CM

## 2019-03-27 DIAGNOSIS — D509 Iron deficiency anemia, unspecified: Secondary | ICD-10-CM | POA: Diagnosis present

## 2019-03-27 DIAGNOSIS — I872 Venous insufficiency (chronic) (peripheral): Secondary | ICD-10-CM | POA: Diagnosis not present

## 2019-03-27 DIAGNOSIS — I878 Other specified disorders of veins: Secondary | ICD-10-CM | POA: Diagnosis not present

## 2019-03-27 DIAGNOSIS — I83018 Varicose veins of right lower extremity with ulcer other part of lower leg: Secondary | ICD-10-CM | POA: Diagnosis not present

## 2019-03-27 DIAGNOSIS — I5030 Unspecified diastolic (congestive) heart failure: Secondary | ICD-10-CM | POA: Diagnosis not present

## 2019-03-27 DIAGNOSIS — I480 Paroxysmal atrial fibrillation: Secondary | ICD-10-CM | POA: Diagnosis present

## 2019-03-27 DIAGNOSIS — E119 Type 2 diabetes mellitus without complications: Secondary | ICD-10-CM | POA: Diagnosis not present

## 2019-03-27 DIAGNOSIS — Z430 Encounter for attention to tracheostomy: Secondary | ICD-10-CM

## 2019-03-27 DIAGNOSIS — K219 Gastro-esophageal reflux disease without esophagitis: Secondary | ICD-10-CM | POA: Diagnosis present

## 2019-03-27 DIAGNOSIS — Y92009 Unspecified place in unspecified non-institutional (private) residence as the place of occurrence of the external cause: Secondary | ICD-10-CM | POA: Diagnosis not present

## 2019-03-27 DIAGNOSIS — I83028 Varicose veins of left lower extremity with ulcer other part of lower leg: Secondary | ICD-10-CM | POA: Diagnosis present

## 2019-03-27 DIAGNOSIS — K5900 Constipation, unspecified: Secondary | ICD-10-CM | POA: Diagnosis not present

## 2019-03-27 DIAGNOSIS — I2510 Atherosclerotic heart disease of native coronary artery without angina pectoris: Secondary | ICD-10-CM | POA: Diagnosis present

## 2019-03-27 DIAGNOSIS — G253 Myoclonus: Secondary | ICD-10-CM | POA: Diagnosis present

## 2019-03-27 DIAGNOSIS — R296 Repeated falls: Secondary | ICD-10-CM | POA: Diagnosis not present

## 2019-03-27 DIAGNOSIS — Z79899 Other long term (current) drug therapy: Secondary | ICD-10-CM

## 2019-03-27 DIAGNOSIS — I4891 Unspecified atrial fibrillation: Secondary | ICD-10-CM | POA: Diagnosis not present

## 2019-03-27 DIAGNOSIS — L97221 Non-pressure chronic ulcer of left calf limited to breakdown of skin: Secondary | ICD-10-CM | POA: Diagnosis not present

## 2019-03-27 DIAGNOSIS — Z951 Presence of aortocoronary bypass graft: Secondary | ICD-10-CM

## 2019-03-27 DIAGNOSIS — R52 Pain, unspecified: Secondary | ICD-10-CM | POA: Diagnosis not present

## 2019-03-27 DIAGNOSIS — J3800 Paralysis of vocal cords and larynx, unspecified: Secondary | ICD-10-CM | POA: Diagnosis not present

## 2019-03-27 DIAGNOSIS — E569 Vitamin deficiency, unspecified: Secondary | ICD-10-CM | POA: Diagnosis not present

## 2019-03-27 DIAGNOSIS — M6281 Muscle weakness (generalized): Secondary | ICD-10-CM | POA: Diagnosis not present

## 2019-03-27 DIAGNOSIS — L97911 Non-pressure chronic ulcer of unspecified part of right lower leg limited to breakdown of skin: Secondary | ICD-10-CM | POA: Diagnosis not present

## 2019-03-27 DIAGNOSIS — N1832 Chronic kidney disease, stage 3b: Secondary | ICD-10-CM | POA: Diagnosis present

## 2019-03-27 DIAGNOSIS — M1000 Idiopathic gout, unspecified site: Secondary | ICD-10-CM | POA: Diagnosis not present

## 2019-03-27 DIAGNOSIS — F17210 Nicotine dependence, cigarettes, uncomplicated: Secondary | ICD-10-CM | POA: Diagnosis present

## 2019-03-27 DIAGNOSIS — S0990XA Unspecified injury of head, initial encounter: Secondary | ICD-10-CM | POA: Diagnosis not present

## 2019-03-27 DIAGNOSIS — I5022 Chronic systolic (congestive) heart failure: Secondary | ICD-10-CM | POA: Diagnosis present

## 2019-03-27 DIAGNOSIS — N182 Chronic kidney disease, stage 2 (mild): Secondary | ICD-10-CM | POA: Diagnosis not present

## 2019-03-27 DIAGNOSIS — J449 Chronic obstructive pulmonary disease, unspecified: Secondary | ICD-10-CM | POA: Diagnosis not present

## 2019-03-27 DIAGNOSIS — I5043 Acute on chronic combined systolic (congestive) and diastolic (congestive) heart failure: Secondary | ICD-10-CM | POA: Diagnosis not present

## 2019-03-27 DIAGNOSIS — S199XXA Unspecified injury of neck, initial encounter: Secondary | ICD-10-CM | POA: Diagnosis not present

## 2019-03-27 LAB — CBC WITH DIFFERENTIAL/PLATELET
Abs Immature Granulocytes: 0.06 10*3/uL (ref 0.00–0.07)
Basophils Absolute: 0.1 10*3/uL (ref 0.0–0.1)
Basophils Relative: 1 %
Eosinophils Absolute: 0.1 10*3/uL (ref 0.0–0.5)
Eosinophils Relative: 1 %
HCT: 30.5 % — ABNORMAL LOW (ref 39.0–52.0)
Hemoglobin: 8.2 g/dL — ABNORMAL LOW (ref 13.0–17.0)
Immature Granulocytes: 1 %
Lymphocytes Relative: 5 %
Lymphs Abs: 0.5 10*3/uL — ABNORMAL LOW (ref 0.7–4.0)
MCH: 20.4 pg — ABNORMAL LOW (ref 26.0–34.0)
MCHC: 26.9 g/dL — ABNORMAL LOW (ref 30.0–36.0)
MCV: 75.9 fL — ABNORMAL LOW (ref 80.0–100.0)
Monocytes Absolute: 1.1 10*3/uL — ABNORMAL HIGH (ref 0.1–1.0)
Monocytes Relative: 11 %
Neutro Abs: 8.7 10*3/uL — ABNORMAL HIGH (ref 1.7–7.7)
Neutrophils Relative %: 81 %
Platelets: 160 10*3/uL (ref 150–400)
RBC: 4.02 MIL/uL — ABNORMAL LOW (ref 4.22–5.81)
RDW: 22.5 % — ABNORMAL HIGH (ref 11.5–15.5)
Smear Review: NORMAL
WBC: 10.7 10*3/uL — ABNORMAL HIGH (ref 4.0–10.5)
nRBC: 0.3 % — ABNORMAL HIGH (ref 0.0–0.2)

## 2019-03-27 LAB — COMPREHENSIVE METABOLIC PANEL
ALT: 16 U/L (ref 0–44)
AST: 35 U/L (ref 15–41)
Albumin: 3.1 g/dL — ABNORMAL LOW (ref 3.5–5.0)
Alkaline Phosphatase: 77 U/L (ref 38–126)
Anion gap: 13 (ref 5–15)
BUN: 129 mg/dL — ABNORMAL HIGH (ref 8–23)
CO2: 22 mmol/L (ref 22–32)
Calcium: 9.2 mg/dL (ref 8.9–10.3)
Chloride: 101 mmol/L (ref 98–111)
Creatinine, Ser: 2.65 mg/dL — ABNORMAL HIGH (ref 0.61–1.24)
GFR calc Af Amer: 27 mL/min — ABNORMAL LOW (ref >60)
GFR calc non Af Amer: 23 mL/min — ABNORMAL LOW (ref >60)
Glucose, Bld: 191 mg/dL — ABNORMAL HIGH (ref 70–99)
Potassium: 5.5 mmol/L — ABNORMAL HIGH (ref 3.5–5.1)
Sodium: 136 mmol/L (ref 135–145)
Total Bilirubin: 1.6 mg/dL — ABNORMAL HIGH (ref 0.3–1.2)
Total Protein: 8.1 g/dL (ref 6.5–8.1)

## 2019-03-27 LAB — LIPID PANEL
Cholesterol: 79 mg/dL (ref 0–200)
HDL: 29 mg/dL — ABNORMAL LOW (ref >40)
LDL Cholesterol: 43 mg/dL (ref 0–99)
Total CHOL/HDL Ratio: 2.7 RATIO
Triglycerides: 36 mg/dL (ref <150)
VLDL: 7 mg/dL (ref 0–40)

## 2019-03-27 LAB — IRON AND TIBC
Iron: 18 ug/dL — ABNORMAL LOW (ref 45–182)
Saturation Ratios: 4 % — ABNORMAL LOW (ref 17.9–39.5)
TIBC: 441 ug/dL (ref 250–450)
UIBC: 423 ug/dL

## 2019-03-27 LAB — SARS CORONAVIRUS 2 (TAT 6-24 HRS): SARS Coronavirus 2: NEGATIVE

## 2019-03-27 LAB — HIV ANTIBODY (ROUTINE TESTING W REFLEX): HIV Screen 4th Generation wRfx: NONREACTIVE

## 2019-03-27 LAB — ETHANOL: Alcohol, Ethyl (B): <10 mg/dL (ref <10)

## 2019-03-27 LAB — TSH: TSH: 4.835 u[IU]/mL — ABNORMAL HIGH (ref 0.350–4.500)

## 2019-03-27 MED ORDER — TETANUS-DIPHTH-ACELL PERTUSSIS 5-2.5-18.5 LF-MCG/0.5 IM SUSP
0.5000 mL | Freq: Once | INTRAMUSCULAR | Status: AC
  Administered 2019-03-27: 12:00:00 0.5 mL via INTRAMUSCULAR
  Filled 2019-03-27: qty 0.5

## 2019-03-27 MED ORDER — LIDOCAINE-EPINEPHRINE 2 %-1:100000 IJ SOLN
20.0000 mL | Freq: Once | INTRAMUSCULAR | Status: AC
  Administered 2019-03-27: 13:00:00 20 mL
  Filled 2019-03-27: qty 1

## 2019-03-27 MED ORDER — ATORVASTATIN CALCIUM 20 MG PO TABS
80.0000 mg | ORAL_TABLET | Freq: Every evening | ORAL | Status: DC
  Administered 2019-03-27 – 2019-04-02 (×7): 80 mg via ORAL
  Filled 2019-03-27 (×7): qty 4

## 2019-03-27 MED ORDER — MORPHINE SULFATE (PF) 2 MG/ML IV SOLN
2.0000 mg | Freq: Once | INTRAVENOUS | Status: AC
  Administered 2019-03-27: 2 mg via INTRAVENOUS

## 2019-03-27 MED ORDER — SODIUM POLYSTYRENE SULFONATE 15 GM/60ML PO SUSP
15.0000 g | Freq: Once | ORAL | Status: DC
  Filled 2019-03-27: qty 60

## 2019-03-27 MED ORDER — LACTATED RINGERS IV SOLN: INTRAVENOUS | Status: AC

## 2019-03-27 MED ORDER — METOPROLOL SUCCINATE ER 50 MG PO TB24
100.0000 mg | ORAL_TABLET | Freq: Every day | ORAL | Status: DC
  Administered 2019-03-27 – 2019-04-03 (×8): 100 mg via ORAL
  Filled 2019-03-27 (×8): qty 2

## 2019-03-27 MED ORDER — MIRTAZAPINE 15 MG PO TABS
7.5000 mg | ORAL_TABLET | Freq: Every day | ORAL | Status: DC
  Administered 2019-03-27 – 2019-04-02 (×7): 7.5 mg via ORAL
  Filled 2019-03-27 (×7): qty 1

## 2019-03-27 MED ORDER — LISINOPRIL 5 MG PO TABS
2.5000 mg | ORAL_TABLET | Freq: Every day | ORAL | Status: DC
  Administered 2019-03-27 – 2019-04-03 (×7): 2.5 mg via ORAL
  Filled 2019-03-27 (×8): qty 1

## 2019-03-27 MED ORDER — FERROUS SULFATE 325 (65 FE) MG PO TABS
324.0000 mg | ORAL_TABLET | Freq: Every day | ORAL | Status: DC
  Administered 2019-03-28 – 2019-04-03 (×7): 324 mg via ORAL
  Filled 2019-03-27 (×7): qty 1

## 2019-03-27 MED ORDER — ALLOPURINOL 100 MG PO TABS
100.0000 mg | ORAL_TABLET | Freq: Every day | ORAL | Status: DC
  Administered 2019-03-27 – 2019-04-03 (×8): 100 mg via ORAL
  Filled 2019-03-27 (×9): qty 1

## 2019-03-27 MED ORDER — GABAPENTIN 100 MG PO CAPS
100.0000 mg | ORAL_CAPSULE | Freq: Two times a day (BID) | ORAL | Status: DC
  Administered 2019-03-27 – 2019-03-29 (×4): 100 mg via ORAL
  Filled 2019-03-27 (×4): qty 1

## 2019-03-27 MED ORDER — PANTOPRAZOLE SODIUM 40 MG PO TBEC
40.0000 mg | DELAYED_RELEASE_TABLET | Freq: Every day | ORAL | Status: DC
  Administered 2019-03-28 – 2019-04-03 (×7): 40 mg via ORAL
  Filled 2019-03-27 (×7): qty 1

## 2019-03-27 MED ORDER — APIXABAN 2.5 MG PO TABS
2.5000 mg | ORAL_TABLET | Freq: Two times a day (BID) | ORAL | Status: DC
  Administered 2019-03-27: 23:00:00 2.5 mg via ORAL
  Filled 2019-03-27 (×2): qty 1

## 2019-03-27 MED ORDER — MORPHINE SULFATE (PF) 4 MG/ML IV SOLN
4.0000 mg | Freq: Once | INTRAVENOUS | Status: AC
  Administered 2019-03-27: 12:00:00 4 mg via INTRAVENOUS
  Filled 2019-03-27: qty 1

## 2019-03-27 MED ORDER — MORPHINE SULFATE (PF) 2 MG/ML IV SOLN
INTRAVENOUS | Status: AC
  Filled 2019-03-27: qty 1

## 2019-03-27 MED ORDER — SODIUM CHLORIDE 0.9 % IV BOLUS
500.0000 mL | Freq: Once | INTRAVENOUS | Status: AC
  Administered 2019-03-27: 500 mL via INTRAVENOUS

## 2019-03-27 NOTE — H&P (Signed)
History and Physical    Danny Glover NOM:[id number] DOB: [birth year] DOA: 03/27/2019  PCP: System, Pcp Not In  Patient coming from: home  I have personally briefly reviewed patient's old medical records in [place]  Chief Complaint: frequent fals  HPI: Kiowa Manas is a 69 y.o. Caucasian male with medical history significant of sCHF with ICD, CAD s/p CABG, Afib on Eliquis, tracheostomy, DM2, HTN who presented after falling and hitting his head.  Pt reported for several days now, he had been falling frequently, and for the past 1 day, had noted dizziness and imbalance prior to the falls.  At baseline, pt walks with a walker, lives at home alone with a [employer] aid.  Has chronic LE swelling, and painful superficial ulcers on both lower legs.  No fever, dyspnea, cough, chest pain, abdominal pain, headache, N/V/D, dysuria.  No hx of known stroke.     ED Course: afebrile, HR 70, BP 147/58, sating 97% on room air.  Labs notable for potassium 5.5, BUN 129, Cr 2.65 (1.54 in May 2020), Hgb 8.2, MCV 75.9, CT head showed "Possible small age-indeterminate infarct of the inferior right basal ganglia."  Pt received stitches to an laceration on his scalp in the ED, and NS 500 ml before admission for observation.  Assessment/Plan Active Problems:   Falls frequently  # Frequent falls --frequent falls in recent days, with dizziness and imbalance prior.  Had sustained scalp laceration.  No report of associated syncope. PLAN: --PT/OT --Stroke workup as below --Cardiac monitoring for arrhythmia  # Possible small age-indeterminate infarct of the inferior right basal ganglia --As seen on head CT. PLAN: --Neurology consult --Hold MRI brain for now since pt has an ICD --neuro checks --PT/OT (pt denied swallowing problems) --A1c, lipid profile, TSH  # Chronic venous stasis ulcers in BLE --Wound care consult  # CKD # Likely AKI --Cr 2.65 on presentation, 1.54 in May 2020 --s/p NS 500 ml in the  ED PLAN: --LR@100  for 10 hours  # Hyperkalemia --Kayexalate x1  # Microcytic anemia --iron profile --continue home iron supplement  # Chronic systolic CHF LVEF 96% with BiV-ICD # Hx of CAD s/p CABG # HTN --continue home metop and Lisinopril --Hold home torsemide due to likely AKI  # Afib on Eliquis --continue home metop --Resume Eliquis at 2.5 mg BID starting tomorrow am (dose was decreased to 5 mg daily)  # s/p tracheostomy in 2006  # DM2 --no diabetic meds on med list --obtain A1c  # HLD --continue home statin  # Hx of gout --continue home allopurinol  # GERD --continue home PI   DVT prophylaxis: GE:ZMOQHUT Code Status: Full code  Family Communication: not today  Disposition Plan: to be determied  Consults called: Neurology Admission status: Inpatient   Review of Systems: As per HPI otherwise 10 point review of systems negative.   Past Medical History:  Diagnosis Date  . CHF (congestive heart failure) ([place])   . Diabetes mellitus without complication ([place])     History reviewed. No pertinent surgical history.   reports that he has been smoking cigarettes. He has been smoking about 1.00 pack per day. He has never used smokeless tobacco. He reports that he does not drink alcohol or use drugs.  No Known Allergies  Family History  Problem Relation Age of Onset  . Hypertension Mother   . Heart attack Father      Prior to Admission medications   Medication Sig Start Date End Date Taking? Authorizing Provider  acetaminophen (TYLENOL) 325 MG tablet Take 650 mg by mouth every 6 (six) hours as needed for mild pain.   Yes [provider]  allopurinol (ZYLOPRIM) 100 MG tablet Take 100 mg by mouth daily.   Yes [provider]  apixaban (ELIQUIS) 5 MG TABS tablet Take 5 mg by mouth every 12 (twelve) hours.   Yes [provider]  atorvastatin (LIPITOR) 80 MG tablet Take 40 mg by mouth at bedtime.    Yes [provider]   cholecalciferol (VITAMIN D) 25 MCG (1000 UNIT) tablet Take 5,000 Units by mouth daily.   Yes [provider]  docusate sodium (COLACE) 100 MG capsule Take 100 mg by mouth daily as needed for mild constipation.   Yes [provider]  ferrous sulfate 324 MG TBEC Take 324 mg by mouth.   Yes [provider]  gabapentin (NEURONTIN) 100 MG capsule Take 100 mg by mouth every 12 (twelve) hours.   Yes [provider]  lisinopril (ZESTRIL) 5 MG tablet Take 2.5 mg by mouth daily.   Yes [provider]  metolazone (ZAROXOLYN) 2.5 MG tablet Take 2.5 mg by mouth daily as needed (fluid).   Yes [provider]  metoprolol (TOPROL-XL) 200 MG 24 hr tablet Take 100 mg by mouth daily.   Yes [provider]  mirtazapine (REMERON) 7.5 MG tablet Take 7.5 mg by mouth at bedtime.   Yes [provider]  Multiple Vitamin (MULTIVITAMIN) tablet Take 1 tablet by mouth daily.   Yes [provider]  pantoprazole (PROTONIX) 40 MG tablet Take 40 mg by mouth daily.   Yes [provider]  torsemide (DEMADEX) 20 MG tablet Take 60 mg by mouth daily. May take 40 mg extra if needed   Yes [provider]    Physical Exam: Vitals:   03/27/19 1630 03/27/19 1700 03/27/19 1845 03/27/19 2011  BP: (!) 146/63 (!) 145/61 (!) 149/56 (!) 132/53  Pulse: 69 72 68 68  Resp: 19 19 18 18   Temp:   (!) 97.5 F (36.4 C) (!) 97.5 F (36.4 C)  TempSrc:   Oral Oral  SpO2: 96% 100% 100% 99%  Weight:      Height:        Constitutional: NAD, oriented, sleepy HEENT: conjunctivae and lids normal, EOMI.  Scalp laceration over top left CV: RRR no M,R,G. Distal pulses +2.  No cyanosis.   RESP: crackles over posterior bases, normal respiratory effort, with trach collar  GI: +BS, NTND Extremities: Edema in BLE SKIN: warm.  Venous stasis ulcers on both lower legs, tender to touch.  Extensive bruising on UE's.   Neuro: II - XII grossly intact.  Sensation  intact Psych: Normal mood and affect.     Labs on Admission: I have personally reviewed following labs and imaging studies  CBC: Recent Labs  Lab 03/27/19 1154  WBC 10.7*  NEUTROABS 8.7*  HGB 8.2*  HCT 30.5*  MCV 75.9*  PLT 160   Basic Metabolic Panel: Recent Labs  Lab 03/27/19 1401  NA 136  K 5.5*  CL 101  CO2 22  GLUCOSE 191*  BUN 129*  CREATININE 2.65*  CALCIUM 9.2   GFR: Estimated Creatinine Clearance: 28.9 mL/min (A) (by C-G formula based on SCr of 2.65 mg/dL (H)). Liver Function Tests: Recent Labs  Lab 03/27/19 1401  AST 35  ALT 16  ALKPHOS 77  BILITOT 1.6*  PROT 8.1  ALBUMIN 3.1*   No results for input(s): LIPASE, AMYLASE in  the last 168 hours. No results for input(s): AMMONIA in the last 168 hours. Coagulation Profile: No results for input(s): INR, PROTIME in the last 168 hours. Cardiac Enzymes: No results for input(s): CKTOTAL, CKMB, CKMBINDEX, TROPONINI in the last 168 hours. BNP (last 3 results) No results for input(s): PROBNP in the last 8760 hours. HbA1C: No results for input(s): HGBA1C in the last 72 hours. CBG: No results for input(s): GLUCAP in the last 168 hours. Lipid Profile: No results for input(s): CHOL, HDL, LDLCALC, TRIG, CHOLHDL, LDLDIRECT in the last 72 hours. Thyroid Function Tests: No results for input(s): TSH, T4TOTAL, FREET4, T3FREE, THYROIDAB in the last 72 hours. Anemia Panel: No results for input(s): VITAMINB12, FOLATE, FERRITIN, TIBC, IRON, RETICCTPCT in the last 72 hours. Urine analysis: No results found for: COLORURINE, APPEARANCEUR, LABSPEC, PHURINE, GLUCOSEU, HGBUR, BILIRUBINUR, KETONESUR, PROTEINUR, UROBILINOGEN, NITRITE, LEUKOCYTESUR  Radiological Exams on Admission: CT HEAD WO CONTRAST  Result Date: 03/27/2019 CLINICAL DATA:  Falls EXAM: CT HEAD WITHOUT CONTRAST TECHNIQUE: Contiguous axial images were obtained from the base of the skull through the vertex without intravenous contrast. COMPARISON:  None.  FINDINGS: Brain: There is no acute intracranial hemorrhage, mass-effect, or edema. Gray-white differentiation is preserved. There is no extra-axial fluid collection. Ventricles and sulci are within normal limits in size and configuration. A small focus of hypoattenuation in the inferior right lentiform nucleus may reflect age-indeterminate small vessel infarct or prominent perivascular space. Vascular: There is atherosclerotic calcification at the skull base. Skull: Calvarium is unremarkable. Sinuses/Orbits: No acute finding. Other: Left posterior scalp hematoma. IMPRESSION: No evidence of acute intracranial injury. Possible small age-indeterminate infarct of the inferior right basal ganglia. Electronically Signed   By: Guadlupe Spanish M.D.   On: 03/27/2019 11:01   CT CERVICAL SPINE WO CONTRAST  Result Date: 03/27/2019 CLINICAL DATA:  Falls EXAM: CT CERVICAL SPINE WITHOUT CONTRAST TECHNIQUE: Multidetector CT imaging of the cervical spine was performed without intravenous contrast. Multiplanar CT image reconstructions were also generated. COMPARISON:  None. FINDINGS: Alignment: Anteroposterior alignment is maintained. Skull base and vertebrae: No acute fracture. Vertebral body heights are preserved. Soft tissues and spinal canal: No prevertebral fluid or swelling. No visible canal hematoma. Disc levels:  Mild multilevel degenerative changes are present. Upper chest: Tracheostomy device is present.  No apical lung mass. Other: Calcified plaque at the common carotid bifurcations. IMPRESSION: No acute cervical spine fracture. Electronically Signed   By: Guadlupe Spanish M.D.   On: 03/27/2019 11:05   DG Chest Portable 1 View  Result Date: 03/27/2019 CLINICAL DATA:  Generalized weakness, falls EXAM: PORTABLE CHEST 1 VIEW COMPARISON:  07/17/2018 FINDINGS: Tracheostomy device is present. Chronic interstitial prominence. There is increased density at the right greater than left lung base. Stable cardiomediastinal  contours. Left chest wall biventricular ICD is present. IMPRESSION: Increased density at the bases, which may reflect atelectasis/consolidation or scarring. Electronically Signed   By: Guadlupe Spanish M.D.   On: 03/27/2019 11:11      Darlin Priestly MD [facility]  If 7PM-7AM, please contact night-coverage 03/27/2019, 9:09 PM

## 2019-03-27 NOTE — ED Triage Notes (Signed)
Patient from home via [facility]. Per EMS, patient has had multiple falls this week. Patient fell while using ambulating with walker. Patient arrives with bleeding laceration to back of head and skin tear to left arm. Patient also with wounds to bilateral legs which are being treated by home health nurse. Patient on anticoagulants.

## 2019-03-27 NOTE — ED Provider Notes (Signed)
[facility] Emergency Department Provider Note  ____________________________________________  Time seen: Approximately 1:21 PM  I have reviewed the triage vital signs and the nursing notes.   HISTORY  Chief Complaint Fall and Laceration    HPI Danny Glover is a 69 y.o. male with a history of CHF diabetes and paroxysmal atrial fibrillation who comes to the ED after multiple falls this week.  Has been unsteady on his feet at home requiring EMS to come to his house daily to assist him back to bed, off the toilet, and today off the floor.  Patient fell back and hit the back of his head on the ground and reports loss of consciousness from the impact today.  Complains of headache, no neck pain no paresthesias or weakness.  Denies chest pain or shortness of breath.  No lower extremity pain.      Past Medical History:  Diagnosis Date  . CHF (congestive heart failure) ([place])   . Diabetes mellitus without complication [facility])      Patient Active Problem List   Diagnosis Date Noted  . Falls frequently 03/27/2019  . Paroxysmal atrial fibrillation ([place]) 01/03/2015  . Chronic obstructive pulmonary disease ([place]) 01/01/2015  . Chronic systolic (congestive) heart failure ([place]) 01/01/2015  . Controlled type 2 diabetes mellitus without complication ([place]) 36/64/4034  . Coronary artery disease involving native coronary artery 01/01/2015  . Essential hypertension 01/01/2015  . History of vocal cord paralysis 01/01/2015  . Hyperlipidemia 01/01/2015  . Tobacco abuse 01/01/2015  . Tracheostomy dependent ([place]) 01/01/2015  . Biventricular ICD (implantable cardioverter-defibrillator) in place 09/01/2014     History reviewed. No pertinent surgical history.   Prior to Admission medications   Medication Sig Start Date End Date Taking? Authorizing Provider  acetaminophen (TYLENOL) 325 MG tablet Take 650 mg by mouth every 6 (six) hours as needed for mild pain.   Yes [provider]  allopurinol (ZYLOPRIM) 100 MG tablet Take 100 mg by mouth daily.   Yes [provider]  apixaban (ELIQUIS) 5 MG TABS tablet Take 5 mg by mouth every 12 (twelve) hours.   Yes [provider]  atorvastatin (LIPITOR) 80 MG tablet Take 40 mg by mouth at bedtime.    Yes [provider]  cholecalciferol (VITAMIN D) 25 MCG (1000 UNIT) tablet Take 5,000 Units by mouth daily.   Yes [provider]  docusate sodium (COLACE) 100 MG capsule Take 100 mg by mouth daily as needed for mild constipation.   Yes [provider]  ferrous sulfate 324 MG TBEC Take 324 mg by mouth.   Yes [provider]  gabapentin (NEURONTIN) 100 MG capsule Take 100 mg by mouth every 12 (twelve) hours.   Yes [provider]  lisinopril (ZESTRIL) 5 MG tablet Take 2.5 mg by mouth daily.   Yes [provider]  metolazone (ZAROXOLYN) 2.5 MG tablet Take 2.5 mg by mouth daily as needed (fluid).   Yes [provider]  metoprolol (TOPROL-XL) 200 MG 24 hr tablet Take 100 mg by mouth daily.   Yes [provider]  mirtazapine (REMERON) 7.5 MG tablet Take 7.5 mg by mouth at bedtime.   Yes [provider]  Multiple Vitamin (MULTIVITAMIN) tablet Take 1 tablet by mouth daily.   Yes [provider]  pantoprazole (PROTONIX) 40 MG tablet Take 40 mg by mouth daily.   Yes [provider]  torsemide (DEMADEX) 20 MG tablet Take 60 mg by mouth daily. May take 40 mg extra  if needed   Yes [provider]     Allergies Patient has no known allergies.   No family history on file.  Social History Social History   Tobacco Use  . Smoking status: Current Every Day Smoker    Packs/day: 1.00    Types: Cigarettes  . Smokeless tobacco: Never Used  Substance Use Topics  . Alcohol use: Never  . Drug use: Never    Review of Systems  Constitutional:   No fever or chills.  ENT:   No sore throat. No  rhinorrhea. Cardiovascular:   No chest pain or syncope. Respiratory:   No dyspnea or cough. Gastrointestinal:   Negative for abdominal pain, vomiting and diarrhea.  Musculoskeletal:   Negative for focal pain or swelling All other systems reviewed and are negative except as documented above in ROS and HPI.  ____________________________________________   PHYSICAL EXAM:  VITAL SIGNS: ED Triage Vitals  Enc Vitals Group     BP 03/27/19 1028 (!) 147/58     Pulse Rate 03/27/19 1028 70     Resp 03/27/19 1028 18     Temp 03/27/19 1028 (!) 97.5 F (36.4 C)     Temp Source 03/27/19 1028 Oral     SpO2 03/27/19 1028 97 %     Weight 03/27/19 1025 200 lb (90.7 kg)     Height 03/27/19 1025 5\' 9"  (1.753 m)     Head Circumference --      Peak Flow --      Pain Score 03/27/19 1024 8     Pain Loc --      Pain Edu? --      Excl. in GC? --     Vital signs reviewed, nursing assessments reviewed.   Constitutional:   Alert and oriented. Non-toxic appearance. Eyes:   Conjunctivae are normal. EOMI. PERRL. ENT      Head:   Normocephalic with 3 cm stellate laceration over the occiput with oozing bleeding.  Palpable subcutaneous hematoma.  No battle sign or raccoon eyes.      Nose:   Wearing a mask.      Mouth/Throat:   Wearing a mask.      Neck:   No meningismus. Full ROM.  No midline spinal tenderness Hematological/Lymphatic/Immunilogical:   No cervical lymphadenopathy. Cardiovascular:   RRR. Symmetric bilateral radial and DP pulses.  No murmurs. Cap refill less than 2 seconds. Respiratory:   Normal respiratory effort without tachypnea/retractions. Breath sounds are clear and equal bilaterally. No wheezes/rales/rhonchi. Gastrointestinal:   Soft and nontender. Non distended. There is no CVA tenderness.  No rebound, rigidity, or guarding.  Musculoskeletal:   Normal range of motion in all extremities. No joint effusions.  No lower extremity tenderness.  No edema. Neurologic:   Normal speech and  language.  Motor grossly intact. No acute focal neurologic deficits are appreciated.  Skin:    Skin is warm, dry and intact. No rash noted.  No petechiae, purpura, or bullae.  ____________________________________________    LABS (pertinent positives/negatives) (all labs ordered are listed, but only abnormal results are displayed) Labs Reviewed  CBC WITH DIFFERENTIAL/PLATELET - Abnormal; Notable for the following components:      Result Value   WBC 10.7 (*)    RBC 4.02 (*)    Hemoglobin 8.2 (*)    HCT 30.5 (*)    MCV 75.9 (*)    MCH 20.4 (*)    MCHC 26.9 (*)    RDW 22.5 (*)    nRBC  0.3 (*)    Neutro Abs 8.7 (*)    Lymphs Abs 0.5 (*)    Monocytes Absolute 1.1 (*)    All other components within normal limits  SARS CORONAVIRUS 2 (TAT 6-24 HRS)  ETHANOL  COMPREHENSIVE METABOLIC PANEL   ____________________________________________   EKG Interpreted by me Sinus rhythm rate of 70, right axis, right bundle branch block.  No acute ischemic changes.   ____________________________________________    RADIOLOGY  CT HEAD WO CONTRAST  Result Date: 03/27/2019 CLINICAL DATA:  Falls EXAM: CT HEAD WITHOUT CONTRAST TECHNIQUE: Contiguous axial images were obtained from the base of the skull through the vertex without intravenous contrast. COMPARISON:  None. FINDINGS: Brain: There is no acute intracranial hemorrhage, mass-effect, or edema. Gray-white differentiation is preserved. There is no extra-axial fluid collection. Ventricles and sulci are within normal limits in size and configuration. A small focus of hypoattenuation in the inferior right lentiform nucleus may reflect age-indeterminate small vessel infarct or prominent perivascular space. Vascular: There is atherosclerotic calcification at the skull base. Skull: Calvarium is unremarkable. Sinuses/Orbits: No acute finding. Other: Left posterior scalp hematoma. IMPRESSION: No evidence of acute intracranial injury. Possible small  age-indeterminate infarct of the inferior right basal ganglia. Electronically Signed   By: Guadlupe Spanish M.D.   On: 03/27/2019 11:01   CT CERVICAL SPINE WO CONTRAST  Result Date: 03/27/2019 CLINICAL DATA:  Falls EXAM: CT CERVICAL SPINE WITHOUT CONTRAST TECHNIQUE: Multidetector CT imaging of the cervical spine was performed without intravenous contrast. Multiplanar CT image reconstructions were also generated. COMPARISON:  None. FINDINGS: Alignment: Anteroposterior alignment is maintained. Skull base and vertebrae: No acute fracture. Vertebral body heights are preserved. Soft tissues and spinal canal: No prevertebral fluid or swelling. No visible canal hematoma. Disc levels:  Mild multilevel degenerative changes are present. Upper chest: Tracheostomy device is present.  No apical lung mass. Other: Calcified plaque at the common carotid bifurcations. IMPRESSION: No acute cervical spine fracture. Electronically Signed   By: Guadlupe Spanish M.D.   On: 03/27/2019 11:05   DG Chest Portable 1 View  Result Date: 03/27/2019 CLINICAL DATA:  Generalized weakness, falls EXAM: PORTABLE CHEST 1 VIEW COMPARISON:  07/17/2018 FINDINGS: Tracheostomy device is present. Chronic interstitial prominence. There is increased density at the right greater than left lung base. Stable cardiomediastinal contours. Left chest wall biventricular ICD is present. IMPRESSION: Increased density at the bases, which may reflect atelectasis/consolidation or scarring. Electronically Signed   By: Guadlupe Spanish M.D.   On: 03/27/2019 11:11    ____________________________________________   PROCEDURES .Marland KitchenLaceration Repair  Date/Time: 03/27/2019 2:59 PM Performed by: Sharman Cheek, MD Authorized by: Sharman Cheek, MD   Consent:    Consent obtained:  Verbal   Consent given by:  Patient   Risks discussed:  Infection, pain, retained foreign body, poor cosmetic result and poor wound healing Anesthesia (see MAR for exact dosages):     Anesthesia method:  Local infiltration   Local anesthetic:  Lidocaine 2% WITH epi Laceration details:    Location:  Scalp   Scalp location:  Occipital   Length (cm):  3 Repair type:    Repair type:  Complex Pre-procedure details:    Preparation:  Imaging obtained to evaluate for foreign bodies Exploration:    Limited defect created (wound extended): no     Hemostasis achieved with:  Direct pressure and epinephrine   Wound exploration: entire depth of wound probed and visualized     Wound extent: no foreign bodies/material noted, no muscle damage noted,  no nerve damage noted, no tendon damage noted, no underlying fracture noted and no vascular damage noted     Contaminated: no   Treatment:    Area cleansed with:  Saline and Betadine   Amount of cleaning:  Extensive   Irrigation solution:  Sterile saline   Visualized foreign bodies/material removed: no     Debridement:  None   Undermining:  Minimal Skin repair:    Repair method:  Sutures   Suture size:  3-0   Wound skin closure material used: ethilon.   Suture technique:  Running   Number of sutures:  8 Approximation:    Approximation:  Close Post-procedure details:    Dressing:  Sterile dressing   Patient tolerance of procedure:  Tolerated well, no immediate complications    ____________________________________________  DIFFERENTIAL DIAGNOSIS   Intracranial hemorrhage, C-spine fracture, dehydration, stroke, electrolyte abnormality, pneumonia .  CLINICAL IMPRESSION / ASSESSMENT AND PLAN / ED COURSE  Medications ordered in the ED: Medications  sodium chloride 0.9 % bolus 500 mL (0 mLs Intravenous Stopped 03/27/19 1242)  lidocaine-EPINEPHrine (XYLOCAINE W/EPI) 2 %-1:100000 (with pres) injection 20 mL (20 mLs Infiltration Given by Other 03/27/19 1230)  Tdap (BOOSTRIX) injection 0.5 mL (0.5 mLs Intramuscular Given 03/27/19 1207)  morphine 4 MG/ML injection 4 mg (4 mg Intravenous Given 03/27/19 1207)    Pertinent labs &  imaging results that were available during my care of the patient were reviewed by me and considered in my medical decision making (see chart for details).  Danny Glover was evaluated in Emergency Department on 03/27/2019 for the symptoms described in the history of present illness. He was evaluated in the context of the global COVID-19 pandemic, which necessitated consideration that the patient might be at risk for infection with the SARS-CoV-2 virus that causes COVID-19. Institutional protocols and algorithms that pertain to the evaluation of patients at risk for COVID-19 are in a state of rapid change based on information released by regulatory bodies including the CDC and federal and state organizations. These policies and algorithms were followed during the patient's care in the ED.   Patient presents with multiple falls, unsteadiness.  CT is negative for intracranial hemorrhage or C-spine fracture, but does show what appears to be a subacute infarct in the basal ganglia, and presentation is consistent with a subacute stroke and this may be causing the symptoms.  He will need to be hospitalized for further stroke work-up and management.  Wound was repaired with 2 sets of running sutures.  CT shows a small subacute appearing infarct in the basal ganglia likely explaining his falls and slurred speech.  Plan to hospitalize for further stroke work-up and management.  May need to hold Eliquis to allow his scalp wound to stop bleeding.      ____________________________________________   FINAL CLINICAL IMPRESSION(S) / ED DIAGNOSES    Final diagnoses:  Acute ischemic stroke [facility])  Multiple falls  Laceration of scalp, initial encounter     ED Discharge Orders    None      Portions of this note were generated with dragon dictation software. Dictation errors may occur despite best attempts at proofreading.   Sharman Cheek, MD 03/27/19 [phone]

## 2019-03-28 LAB — CBC
HCT: 27.4 % — ABNORMAL LOW (ref 39.0–52.0)
Hemoglobin: 7.3 g/dL — ABNORMAL LOW (ref 13.0–17.0)
MCH: 20.1 pg — ABNORMAL LOW (ref 26.0–34.0)
MCHC: 26.6 g/dL — ABNORMAL LOW (ref 30.0–36.0)
MCV: 75.5 fL — ABNORMAL LOW (ref 80.0–100.0)
Platelets: 156 10*3/uL (ref 150–400)
RBC: 3.63 MIL/uL — ABNORMAL LOW (ref 4.22–5.81)
RDW: 22.3 % — ABNORMAL HIGH (ref 11.5–15.5)
WBC: 7.7 10*3/uL (ref 4.0–10.5)
nRBC: 0.5 % — ABNORMAL HIGH (ref 0.0–0.2)

## 2019-03-28 LAB — LACTATE DEHYDROGENASE: LDH: 160 U/L (ref 98–192)

## 2019-03-28 LAB — BASIC METABOLIC PANEL
Anion gap: 8 (ref 5–15)
BUN: 114 mg/dL — ABNORMAL HIGH (ref 8–23)
CO2: 27 mmol/L (ref 22–32)
Calcium: 9.2 mg/dL (ref 8.9–10.3)
Chloride: 105 mmol/L (ref 98–111)
Creatinine, Ser: 1.96 mg/dL — ABNORMAL HIGH (ref 0.61–1.24)
GFR calc Af Amer: 39 mL/min — ABNORMAL LOW (ref >60)
GFR calc non Af Amer: 34 mL/min — ABNORMAL LOW (ref >60)
Glucose, Bld: 110 mg/dL — ABNORMAL HIGH (ref 70–99)
Potassium: 5.6 mmol/L — ABNORMAL HIGH (ref 3.5–5.1)
Sodium: 140 mmol/L (ref 135–145)

## 2019-03-28 LAB — RETICULOCYTES
Immature Retic Fract: 32.1 % — ABNORMAL HIGH (ref 2.3–15.9)
RBC.: 3.74 MIL/uL — ABNORMAL LOW (ref 4.22–5.81)
Retic Count, Absolute: 102.5 10*3/uL (ref 19.0–186.0)
Retic Ct Pct: 2.7 % (ref 0.4–3.1)

## 2019-03-28 LAB — HEMOGLOBIN A1C
Hgb A1c MFr Bld: 7.3 % — ABNORMAL HIGH (ref 4.8–5.6)
Mean Plasma Glucose: 162.81 mg/dL

## 2019-03-28 LAB — MAGNESIUM: Magnesium: 2 mg/dL (ref 1.7–2.4)

## 2019-03-28 MED ORDER — LACTATED RINGERS IV SOLN: INTRAVENOUS | Status: DC

## 2019-03-28 MED ORDER — SODIUM ZIRCONIUM CYCLOSILICATE 5 G PO PACK
5.0000 g | PACK | Freq: Once | ORAL | Status: AC
  Administered 2019-03-28: 14:00:00 5 g via ORAL
  Filled 2019-03-28: qty 1

## 2019-03-28 MED ORDER — SODIUM CHLORIDE 0.9% IV SOLUTION: Freq: Once | INTRAVENOUS | Status: AC

## 2019-03-28 MED ORDER — ENOXAPARIN SODIUM 40 MG/0.4ML ~~LOC~~ SOLN
40.0000 mg | SUBCUTANEOUS | Status: DC
  Administered 2019-03-28 – 2019-04-02 (×6): 40 mg via SUBCUTANEOUS
  Filled 2019-03-28 (×6): qty 0.4

## 2019-03-28 MED ORDER — HYDROCERIN EX CREA
TOPICAL_CREAM | CUTANEOUS | Status: DC
  Filled 2019-03-28: qty 113

## 2019-03-28 NOTE — Progress Notes (Signed)
Pt. suctionned for large amt. Of thick yellowish brown secretions. Pt. Tolerated procedure well.

## 2019-03-28 NOTE — TOC Progression Note (Signed)
Transition of Care [facility]) - Progression Note    Patient Details  Name: Danny Glover MRN: [id number] Date of Birth: 10/29/1950  Transition of Care [facility]) CM/SW Contact  Barrie Dunker, RN Phone Number: 03/28/2019, 11:45 AM  Clinical Narrative:    Received a call from Ashok Norris from the [facility] asking to get a call from the physician.  I took the number [phone] and notified the attending that he wants a call        Expected Discharge Plan and Services                                                 Social Determinants of Health (SDOH) Interventions    Readmission Risk Interventions No flowsheet data found.

## 2019-03-28 NOTE — Consult Note (Signed)
WOC Nurse Consult Note: Reason for Consult: painful LE ulcers Wound type: venous stasis  Pressure Injury POA:NA Measurement: see nursing flow sheet (entered by WOC nurse) Wound bed: R posterior; 90% pink/10% black Left lateral; 50% pink/25% black/25% yellow Drainage (amount, consistency, odor) moderate from each site Periwound: hemosiderin staining with venous dermatitis  Dressing procedure/placement/frequency: Apply silicone non adherent to the right posterior wound due to intense pain, topped with alginate for drainage control; topped with silicone foam Apply alginate to the left lateral wound; top with silicone foam Dry compression wraps used because I can not find any documentation of LE studies that would indicate safety for higher levels of compression. Ideally needs ABI and 20-6mmHG compression wraps until his ulcers are healed and then long term compression stockings.  Would recommend follow up in a wound care center of the patient's choice for management of chronic venous stasis.   Discussed POC with patient and bedside nurse.  Re consult if needed, will not follow at this time. Thanks  Samer Dutton [employer], RN,CWOCN, CNS, CWON-AP [phone])

## 2019-03-28 NOTE — Progress Notes (Signed)
PT Cancellation Note  Patient Details Name: Danny Glover MRN: [id number] DOB: 25-Apr-1950   Cancelled Treatment:    Reason Eval/Treat Not Completed: Medical issues which prohibited therapy; Pt's Ka 5.6 falling outside guidelines for participation with PT services.  Will attempt to see pt at a future date/time as medically appropriate.    Ovidio Hanger PT, DPT 03/28/19, 9:09 AM

## 2019-03-28 NOTE — Progress Notes (Signed)
D: Pt alert and oriented. Pt observed as tired/drowsy. Trach care offered pt refused. Trach care offered again along with calling respiratory pt except as long as respiratory provided the care. Daughter at bedside.  A: Scheduled medications administered to pt, per MD orders. Support and encouragement provided. Frequent verbal contact made.   R: No adverse drug reactions noted. Pt complaint with medications and treatment plan. Pt interacts well with staff on the unit. Will continue to monitor, and provide care for as ordered.

## 2019-03-28 NOTE — Progress Notes (Addendum)
PROGRESS NOTE    Volney Reierson  YBO:[id number] DOB: 1950/08/07 DOA: 03/27/2019 PCP: System, Pcp Not In    Assessment & Plan:   Active Problems:   Falls frequently    Mihir Paar is a 69 y.o. Caucasian male with medical history significant of sCHF with ICD, CAD s/p CABG, Afib on Eliquis, tracheostomy, DM2, HTN who presented after falling and hitting his head.   # Frequent falls --frequent falls in recent days, with dizziness and imbalance prior.  Had sustained scalp laceration.  No report of associated syncope. PLAN: --PT/OT --Stroke workup as below --Cardiac monitoring for arrhythmia  # Possible small age-indeterminate infarct of the inferior right basal ganglia --As seen on head CT. --A1c 7.3, LDL 43, TSH 4.8 PLAN: --Neurology consult --Hold MRI brain for now since pt has an ICD --neuro checks --PT/OT (pt denied swallowing problems)  # Chronic venous stasis ulcers in BLE --Does not appear to be cellulitis --Wound care consulted, ACE wrap applied.  # CKD # AKI, improved --Cr 2.65 on presentation, 1.54 in May 2020.  AKI likely pre-renal from poor PO intake and hydration. --s/p NS 500 ml in the ED and LR@100  for 10 hours PLAN: --Repeat LR@100  for 10 hours today --Encourage PO hydration  # Hyperkalemia --Pt refused Kayexalate --order Lokelma instead today  # Microcytic anemia 2/2 iron def --iron profile showed severe iron def.  Retic elevated at 32%, LDH wnl.  Pt denied signs of GI bleed. --Pt consented to blood transfusion PLAN: --continue home iron supplement --transfuse if Hgb <7  # Chronic systolic CHF LVEF 27% with BiV-ICD # Hx of CAD s/p CABG # HTN --continue home metop and Lisinopril --Hold home torsemide due to likely AKI  # Afib on Eliquis --continue home metop --Hold Eliquis for now since Hgb is trending down  # s/p tracheostomy in 2006  # DM2 --no diabetic meds on med list --A1c 7.3 --No need fo fingersticks or insulin   #  HLD --continue home statin  # Hx of gout --continue home allopurinol  # GERD --continue home PI   DVT prophylaxis: Lovenox SQ Code Status: Full code  Family Communication: updated daughter at bedside.  Attempted to call pt's PCP at [facility] [place]   [facility]   Subjective and Interval History:  Pt reported not feeling well, but no specifics, just generalized weakness.  No fever, dyspnea, chest pain, abdominal pain, N/V/D, dysuria, increased swelling.  Continue to have copious secretion from his trach.    Objective: Vitals:   03/28/19 0425 03/28/19 0810 03/28/19 1215 03/28/19 1512  BP: 121/63 (!) 130/59 (!) 122/53 (!) 116/50  Pulse: 70 71 68 70  Resp: 14 17 17 18   Temp: (!) 97.3 F (36.3 C)  98.2 F (36.8 C) 98.3 F (36.8 C)  TempSrc: Oral  Oral Oral  SpO2: 100% 95% 99% 100%  Weight:      Height:        Intake/Output Summary (Last 24 hours) at 03/28/2019 2150 Last data filed at 03/28/2019 0945 Gross per 24 hour  Intake 1255.21 ml  Output --  Net 1255.21 ml   Filed Weights   03/27/19 1025  Weight: 90.7 kg    Examination:   Constitutional: NAD, oriented, lethargic HEENT: conjunctivae and lids normal, EOMI.  Scalp laceration over top left CV: RRR no M,R,G. Distal pulses +2.  No cyanosis.   RESP: crackles over posterior bases, rhonchi, gurgling sounds, normal respiratory effort, with trach collar, on RA GI: +  BS, NTND Extremities: BLE with ACE wrap SKIN: warm.  Extensive bruising on UE's.   Neuro: II - XII grossly intact.  Sensation intact Psych: Depressed mood and affect.     Data Reviewed: I have personally reviewed following labs and imaging studies  CBC: Recent Labs  Lab 03/27/19 1154 03/28/19 0550  WBC 10.7* 7.7  NEUTROABS 8.7*  --   HGB 8.2* 7.3*  HCT 30.5* 27.4*  MCV 75.9* 75.5*  PLT 160 156   Basic Metabolic Panel: Recent Labs  Lab 03/27/19 1401 03/28/19 0550  NA 136 140  K 5.5* 5.6*  CL 101 105  CO2 22 27   GLUCOSE 191* 110*  BUN 129* 114*  CREATININE 2.65* 1.96*  CALCIUM 9.2 9.2  MG  --  2.0   GFR: Estimated Creatinine Clearance: 39 mL/min (A) (by C-G formula based on SCr of 1.96 mg/dL (H)). Liver Function Tests: Recent Labs  Lab 03/27/19 1401  AST 35  ALT 16  ALKPHOS 77  BILITOT 1.6*  PROT 8.1  ALBUMIN 3.1*   No results for input(s): LIPASE, AMYLASE in the last 168 hours. No results for input(s): AMMONIA in the last 168 hours. Coagulation Profile: No results for input(s): INR, PROTIME in the last 168 hours. Cardiac Enzymes: No results for input(s): CKTOTAL, CKMB, CKMBINDEX, TROPONINI in the last 168 hours. BNP (last 3 results) No results for input(s): PROBNP in the last 8760 hours. HbA1C: Recent Labs    03/27/19 1401  HGBA1C 7.3*   CBG: No results for input(s): GLUCAP in the last 168 hours. Lipid Profile: Recent Labs    03/27/19 1401  CHOL 79  HDL 29*  LDLCALC 43  TRIG 36  CHOLHDL 2.7   Thyroid Function Tests: Recent Labs    03/27/19 1401  TSH 4.835*   Anemia Panel: Recent Labs    03/27/19 1401 03/28/19 0853  TIBC 441  --   IRON 18*  --   RETICCTPCT  --  2.7   Sepsis Labs: No results for input(s): PROCALCITON, LATICACIDVEN in the last 168 hours.  Recent Results (from the past 240 hour(s))  SARS CORONAVIRUS 2 (TAT 6-24 HRS) Nasopharyngeal Nasopharyngeal Swab     Status: None   Collection Time: 03/27/19 11:54 AM   Specimen: Nasopharyngeal Swab  Result Value Ref Range Status   SARS Coronavirus 2 NEGATIVE NEGATIVE Final    Comment: (NOTE) SARS-CoV-2 target nucleic acids are NOT DETECTED. The SARS-CoV-2 RNA is generally detectable in upper and lower respiratory specimens during the acute phase of infection. Negative results do not preclude SARS-CoV-2 infection, do not rule out co-infections with other pathogens, and should not be used as the sole basis for treatment or other patient management decisions. Negative results must be combined with  clinical observations, patient history, and epidemiological information. The expected result is Negative. Fact Sheet for Patients: HairSlick.no Fact Sheet for Healthcare Providers: quierodirigir.com This test is not yet approved or cleared by the [place] FDA and  has been authorized for detection and/or diagnosis of SARS-CoV-2 by FDA under an Emergency Use Authorization (EUA). This EUA will remain  in effect (meaning this test can be used) for the duration of the COVID-19 declaration under Section 56 4(b)(1) of the Act, 21 U.S.C. section 360bbb-3(b)(1), unless the authorization is terminated or revoked sooner. Performed at [facility], [address], [place], [place] [address]       Radiology Studies: CT HEAD WO CONTRAST  Result Date: 03/27/2019 CLINICAL DATA:  Falls EXAM: CT  HEAD WITHOUT CONTRAST TECHNIQUE: Contiguous axial images were obtained from the base of the skull through the vertex without intravenous contrast. COMPARISON:  None. FINDINGS: Brain: There is no acute intracranial hemorrhage, mass-effect, or edema. Gray-white differentiation is preserved. There is no extra-axial fluid collection. Ventricles and sulci are within normal limits in size and configuration. A small focus of hypoattenuation in the inferior right lentiform nucleus may reflect age-indeterminate small vessel infarct or prominent perivascular space. Vascular: There is atherosclerotic calcification at the skull base. Skull: Calvarium is unremarkable. Sinuses/Orbits: No acute finding. Other: Left posterior scalp hematoma. IMPRESSION: No evidence of acute intracranial injury. Possible small age-indeterminate infarct of the inferior right basal ganglia. Electronically Signed   By: Guadlupe Spanish M.D.   On: 03/27/2019 11:01   CT CERVICAL SPINE WO CONTRAST  Result Date: 03/27/2019 CLINICAL DATA:  Falls EXAM: CT CERVICAL SPINE WITHOUT CONTRAST  TECHNIQUE: Multidetector CT imaging of the cervical spine was performed without intravenous contrast. Multiplanar CT image reconstructions were also generated. COMPARISON:  None. FINDINGS: Alignment: Anteroposterior alignment is maintained. Skull base and vertebrae: No acute fracture. Vertebral body heights are preserved. Soft tissues and spinal canal: No prevertebral fluid or swelling. No visible canal hematoma. Disc levels:  Mild multilevel degenerative changes are present. Upper chest: Tracheostomy device is present.  No apical lung mass. Other: Calcified plaque at the common carotid bifurcations. IMPRESSION: No acute cervical spine fracture. Electronically Signed   By: Guadlupe Spanish M.D.   On: 03/27/2019 11:05   DG Chest Portable 1 View  Result Date: 03/27/2019 CLINICAL DATA:  Generalized weakness, falls EXAM: PORTABLE CHEST 1 VIEW COMPARISON:  07/17/2018 FINDINGS: Tracheostomy device is present. Chronic interstitial prominence. There is increased density at the right greater than left lung base. Stable cardiomediastinal contours. Left chest wall biventricular ICD is present. IMPRESSION: Increased density at the bases, which may reflect atelectasis/consolidation or scarring. Electronically Signed   By: Guadlupe Spanish M.D.   On: 03/27/2019 11:11     Scheduled Meds: . allopurinol  100 mg Oral Daily  . apixaban  2.5 mg Oral BID  . atorvastatin  80 mg Oral Nightly  . ferrous sulfate  324 mg Oral Q breakfast  . gabapentin  100 mg Oral Q12H  . hydrocerin   Topical Once per day on Mon Thu  . lisinopril  2.5 mg Oral Daily  . metoprolol  100 mg Oral Daily  . mirtazapine  7.5 mg Oral QHS  . pantoprazole  40 mg Oral Daily   Continuous Infusions:   LOS: 1 day     Darlin Priestly, MD [facility] If 7PM-7AM, please contact night-coverage 03/28/2019, 9:50 PM

## 2019-03-28 NOTE — Progress Notes (Signed)
OT Cancellation Note  Patient Details Name: Danny Glover MRN: [id number] DOB: 04-Mar-1951   Cancelled Treatment:    Reason Eval/Treat Not Completed: Medical issues which prohibited therapy. Thank you for the OT consult. Order received and chart reviewed. Pt noted to have Ka value of 5.6 falling outside guidelines for participation with OT services.  Will attempt to see pt at a future date/time as medically appropriate.  Rockney Ghee, M.S., OTR/L Ascom: [phone] 03/28/19, 9:26 AM

## 2019-03-29 LAB — CBC
HCT: 26.3 % — ABNORMAL LOW (ref 39.0–52.0)
Hemoglobin: 7 g/dL — ABNORMAL LOW (ref 13.0–17.0)
MCH: 20.3 pg — ABNORMAL LOW (ref 26.0–34.0)
MCHC: 26.6 g/dL — ABNORMAL LOW (ref 30.0–36.0)
MCV: 76.5 fL — ABNORMAL LOW (ref 80.0–100.0)
Platelets: 157 10*3/uL (ref 150–400)
RBC: 3.44 MIL/uL — ABNORMAL LOW (ref 4.22–5.81)
RDW: 22.4 % — ABNORMAL HIGH (ref 11.5–15.5)
WBC: 8.1 10*3/uL (ref 4.0–10.5)
nRBC: 0.4 % — ABNORMAL HIGH (ref 0.0–0.2)

## 2019-03-29 LAB — BASIC METABOLIC PANEL
Anion gap: 7 (ref 5–15)
BUN: 99 mg/dL — ABNORMAL HIGH (ref 8–23)
CO2: 26 mmol/L (ref 22–32)
Calcium: 8.8 mg/dL — ABNORMAL LOW (ref 8.9–10.3)
Chloride: 105 mmol/L (ref 98–111)
Creatinine, Ser: 1.74 mg/dL — ABNORMAL HIGH (ref 0.61–1.24)
GFR calc Af Amer: 45 mL/min — ABNORMAL LOW (ref >60)
GFR calc non Af Amer: 39 mL/min — ABNORMAL LOW (ref >60)
Glucose, Bld: 123 mg/dL — ABNORMAL HIGH (ref 70–99)
Potassium: 5.2 mmol/L — ABNORMAL HIGH (ref 3.5–5.1)
Sodium: 138 mmol/L (ref 135–145)

## 2019-03-29 LAB — PREPARE RBC (CROSSMATCH)

## 2019-03-29 LAB — ABO/RH: ABO/RH(D): A NEG

## 2019-03-29 LAB — MAGNESIUM: Magnesium: 1.9 mg/dL (ref 1.7–2.4)

## 2019-03-29 MED ORDER — OXYCODONE HCL 5 MG PO TABS
5.0000 mg | ORAL_TABLET | ORAL | Status: DC | PRN
  Administered 2019-03-29 – 2019-03-31 (×4): 5 mg via ORAL
  Filled 2019-03-29 (×4): qty 1

## 2019-03-29 MED ORDER — SODIUM CHLORIDE 0.9% IV SOLUTION: Freq: Once | INTRAVENOUS | Status: AC

## 2019-03-29 NOTE — Evaluation (Signed)
Physical Therapy Evaluation Patient Details Name: Danny Glover MRN: [id number] DOB: 1950/06/28 Today's Date: 03/29/2019   History of Present Illness  Pt is 69 y/o M with PMH that includes: sCHF with ICD, CAD s/p CABG, Afib, trach (chronic, since 2006), T2DM, and HTN. Pt presented after fall at home with dizziness and hit his head. Stroke workup consisted of CT (unable to perform MRI d/t ICD) which revealed small, age-indeterminent infarct of R basal ganglia.    Clinical Impression  Pt somewhat lethargic upon entering room but level of alertness improved with conversation.  Pt actively participated throughout the session but fatigued quickly requiring frequent therapeutic rest breaks.  Pt did not require physical assistance with mobility but all tasks required significantly increased time and effort and the pt was only able to amb a max of 12 feet before fatiguing to the point where he had to sit down.  Pt's SpO2 on room air was in the low 90s during the session with HR WNL.  Pt reported no adverse symptoms other than fatigue.  Pt will benefit from PT services in a SNF setting upon discharge to safely address deficits listed in patient problem list for decreased caregiver assistance and eventual return to PLOF.      Follow Up Recommendations SNF    Equipment Recommendations  None recommended by PT    Recommendations for Other Services       Precautions / Restrictions Precautions Precautions: Fall Restrictions Weight Bearing Restrictions: No      Mobility  Bed Mobility Overal bed mobility: Modified Independent             General bed mobility comments: Extra time and effort required for bed mobility tasks but no physical assistance needed  Transfers Overall transfer level: Needs assistance Equipment used: Rolling walker (2 wheeled) Transfers: Sit to/from Stand Sit to Stand: Min guard         General transfer comment: Min verbal cues for hand placement with good control  and stability and no adverse symptoms in standing  Ambulation/Gait Ambulation/Gait assistance: Min guard Gait Distance (Feet): 12 Feet Assistive device: Rolling walker (2 wheeled) Gait Pattern/deviations: Step-through pattern;Decreased step length - right;Decreased step length - left;Trunk flexed Gait velocity: decreased   General Gait Details: Pt generally steady with amb with mod lean on the RW for support but limited by fatigue  Stairs            Wheelchair Mobility    Modified Rankin (Stroke Patients Only)       Balance Overall balance assessment: Needs assistance Sitting-balance support: Feet supported Sitting balance-Leahy Scale: Good     Standing balance support: Bilateral upper extremity supported Standing balance-Leahy Scale: Fair Standing balance comment: Mod lean on the RW for support but steady without LOB                             Pertinent Vitals/Pain Pain Assessment: No/denies pain    Home Living Family/patient expects to be discharged to:: Private residence Living Arrangements: Alone Available Help at Discharge: Personal care attendant Type of Home: Apartment Home Access: Level entry     Home Layout: One level Home Equipment: Walker - 4 wheels;Bedside commode;Grab bars - tub/shower;Other (comment);Walker - 2 wheels      Prior Function Level of Independence: Independent with assistive device(s)         Comments: Mod Ind amb limited community distances with a rollator, Ind with ADLs  Hand Dominance        Extremity/Trunk Assessment   Upper Extremity Assessment Upper Extremity Assessment: Defer to OT evaluation    Lower Extremity Assessment Lower Extremity Assessment: Generalized weakness;LLE deficits/detail;RLE deficits/detail RLE Deficits / Details: General weakness to BLEs, grossly 3+ to 4-/5 but equal left vs right RLE Sensation: WNL LLE Deficits / Details: General weakness to BLEs, grossly 3+ to 4-/5 but  equal left vs right LLE Sensation: WNL       Communication   Communication: Tracheostomy  Cognition Arousal/Alertness: Awake/alert Behavior During Therapy: WFL for tasks assessed/performed Overall Cognitive Status: Within Functional Limits for tasks assessed                                        General Comments      Exercises Total Joint Exercises Ankle Circles/Pumps: AROM;Strengthening;Both;10 reps Quad Sets: AROM;Strengthening;Both;5 reps Gluteal Sets: Strengthening;Both;10 reps Hip ABduction/ADduction: AROM;Both;5 reps;Strengthening Straight Leg Raises: AROM;Strengthening;Both;5 reps Long Arc Quad: AROM;Strengthening;Both;10 reps Knee Flexion: AROM;Strengthening;Both;10 reps Marching in Standing: AROM;Strengthening;Both;10 reps;Standing Other Exercises Other Exercises: HEP education for BLE APs, QS, GS, and LAQs x 10 each 4-5x/day   Assessment/Plan    PT Assessment Patient needs continued PT services  PT Problem List Decreased strength;Decreased activity tolerance;Decreased balance;Decreased mobility;Decreased knowledge of use of DME       PT Treatment Interventions DME instruction;Gait training;Stair training;Functional mobility training;Therapeutic activities;Therapeutic exercise;Balance training;Patient/family education    PT Goals (Current goals can be found in the Care Plan section)  Acute Rehab PT Goals Patient Stated Goal: To get stronger and walk better PT Goal Formulation: With patient Time For Goal Achievement: 04/11/19 Potential to Achieve Goals: Good    Frequency Min 2X/week   Barriers to discharge Inaccessible home environment;Decreased caregiver support      Co-evaluation               AM-PAC PT "6 Clicks" Mobility  Outcome Measure Help needed turning from your back to your side while in a flat bed without using bedrails?: A Little Help needed moving from lying on your back to sitting on the side of a flat bed without  using bedrails?: A Little Help needed moving to and from a bed to a chair (including a wheelchair)?: A Little Help needed standing up from a chair using your arms (e.g., wheelchair or bedside chair)?: A Little Help needed to walk in hospital room?: A Lot Help needed climbing 3-5 steps with a railing? : Total 6 Click Score: 15    End of Session Equipment Utilized During Treatment: Gait belt Activity Tolerance: Patient limited by fatigue Patient left: in chair;with call bell/phone within reach;with chair alarm set Nurse Communication: Mobility status PT Visit Diagnosis: Muscle weakness (generalized) (M62.81);Difficulty in walking, not elsewhere classified (R26.2);History of falling (Z91.81)    Time: 9604-5409 PT Time Calculation (min) (ACUTE ONLY): 30 min   Charges:   PT Evaluation $PT Eval Moderate Complexity: 1 Mod PT Treatments $Therapeutic Exercise: 8-22 mins        D. Royetta Asal PT, DPT 03/29/19, 5:03 PM

## 2019-03-29 NOTE — Progress Notes (Signed)
Pt refusing trach care ( suction and cleaning)  from both RN and respiratory. NP E Ouma made aware, will continue to attempt

## 2019-03-29 NOTE — Consult Note (Signed)
Danny Glover, Danny Glover [id number] April 14, 1950 Darlin Priestly, MD  Reason for Consult: Tracheostomy issues, shortness of breath  HPI: 69 y.o. male with longstanding history of tracheostomy use admitted to hospital after fall and CVA workup.  Patient reports has had trach since CABG resulted in vocal fold paralysis.  Reports difficulty breathing now and RT attempted to suction with minimal ability to thread 10FR suction through trach.  Patient currently has low profile Jackson metal tracheostomy tube without inner cannula and concern is for blockage of airway.  Patient reports difficulty breathing in but good voice.  Patient prefers a low profile metal tacheostomy tube.  Allergies: No Known Allergies  ROS: Review of systems normal other than 12 systems except per HPI.  PMH:  Past Medical History:  Diagnosis Date  . CHF (congestive heart failure) (HCC)   . Diabetes mellitus without complication (HCC)     FH:  Family History  Problem Relation Age of Onset  . Hypertension Mother   . Heart attack Father     SH:  Social History   Socioeconomic History  . Marital status: Divorced    Spouse name: Not on file  . Number of children: Not on file  . Years of education: Not on file  . Highest education level: Not on file  Occupational History  . Not on file  Tobacco Use  . Smoking status: Current Every Day Smoker    Packs/day: 1.00    Types: Cigarettes  . Smokeless tobacco: Never Used  Substance and Sexual Activity  . Alcohol use: Never  . Drug use: Never  . Sexual activity: Not on file  Other Topics Concern  . Not on file  Social History Narrative  . Not on file   Social Determinants of Health   Financial Resource Strain:   . Difficulty of Paying Living Expenses: Not on file  Food Insecurity:   . Worried About Programme researcher, broadcasting/film/video in the Last Year: Not on file  . Ran Out of Food in the Last Year: Not on file  Transportation Needs:   . Lack of Transportation (Medical): Not on file  .  Lack of Transportation (Non-Medical): Not on file  Physical Activity:   . Days of Exercise per Week: Not on file  . Minutes of Exercise per Session: Not on file  Stress:   . Feeling of Stress : Not on file  Social Connections:   . Frequency of Communication with Friends and Family: Not on file  . Frequency of Social Gatherings with Friends and Family: Not on file  . Attends Religious Services: Not on file  . Active Member of Clubs or Organizations: Not on file  . Attends Banker Meetings: Not on file  . Marital Status: Not on file  Intimate Partner Violence:   . Fear of Current or Ex-Partner: Not on file  . Emotionally Abused: Not on file  . Physically Abused: Not on file  . Sexually Abused: Not on file    PSH: History reviewed. No pertinent surgical history.  Physical  Exam:  GEN- NAD, sitting upright in bed eating EARS-  Bilateral external ears clear NOSE- no mucopus or polyps OC/OP-  No masses or lesions NECK-  Metal Jackson trach in place with significant crusting and blockage of tube.  Well healed tracheostoma RESP-  Inspiratory stridor, breathy voice EXT-  Active bleeding from left hand where transfusion taking place and leaking around IV  Procedure:  Tracheostomy change.  Preprocedure diagnosis:  Shortness of breath.  Post-procedure diagnosis:  Same.  After verbal consent obtained, the patient was placed supine in bed.  The existing tracheostomy tube was gently removed revealing a well healed trach.  A size 4 Shiley uncuffed non-fenestrated tracheostomy tube was placed through the tracheostoma without difficulty and secured with tie.  Patient tolerated procedure well and was able to inspire easier without stridor once this was placed.  A/P: Tracheostomy dependence secondary to vocal fold paralysis s/p change to Shiley Size 4.  Tract is well healed and should be easily switched back to [place] by RT once supplies are secured in future.  Patient reports unable to  tolerated PMV in past so will hold on speech consult as uses finger occlusion instead.   Zahli Vetsch 03/29/2019 1:08 PM

## 2019-03-29 NOTE — Progress Notes (Signed)
PROGRESS NOTE    Danny Glover  RJJ:[id number] DOB: 1951-02-09 DOA: 03/27/2019 PCP: System, Pcp Not In    Assessment & Plan:   Active Problems:   Falls frequently    Danny Glover is a 69 y.o. Caucasian male with medical history significant of sCHF with ICD, CAD s/p CABG, Afib on Eliquis, tracheostomy, DM2, HTN who presented after falling and hitting his head.   # Frequent falls --frequent falls in recent days, with dizziness and imbalance prior.  Had sustained scalp laceration.  No report of associated syncope.  PCP had noted decline. PLAN: --PT/OT rec SNF  --Stroke workup as below --Cardiac monitoring for arrhythmia  # Possible small age-indeterminate infarct of the inferior right basal ganglia --As seen on head CT. --A1c 7.3, LDL 43, TSH 4.8 PLAN: --Neurology consult --Hold MRI brain for now since pt has an ICD --d/c neuro checks --PT/OT (pt denied swallowing problems) --smoking cessation  # Chronic venous stasis ulcers in BLE --Does not appear to be cellulitis --Wound care consulted, ACE wrap applied.  # CKD # AKI, improved --Cr 2.65 on presentation, 1.54 in May 2020.  AKI likely pre-renal from poor PO intake and hydration. --s/p NS 500 ml in the ED and LR@100  for 10 hours per day for 2 days PLAN: --Hold further IVF today --Encourage PO hydration  # Hyperkalemia --Pt refused Kayexalate --order Lokelma instead   # Microcytic anemia 2/2 iron def --iron profile showed severe iron def.  Retic elevated at 32%, LDH wnl.  Pt denied signs of GI bleed.  Does not tolerate oral iron supplement. --Pt consented to blood transfusion PLAN: --1u pRBC today --transfuse if Hgb <7  # Chronic systolic CHF LVEF 35% with BiV-ICD # Hx of CAD s/p CABG # HTN --continue home metop and Lisinopril --Hold home torsemide due to AKI  # Afib on Eliquis --continue home metop --Hold Eliquis for now since Hgb is trending down  # s/p tracheostomy in 2006  # DM2 --no diabetic  meds on med list --A1c 7.3 --No need fo fingersticks or insulin   # HLD --continue home statin  # Hx of gout --continue home allopurinol  # GERD --continue home PI   DVT prophylaxis: Lovenox SQ Code Status: Full code  Family Communication: updated pt's PCP at [place] [employer] on the phone today  [facility]   Subjective and Interval History:  Pt reported feeling better today, was seen up sitting at side of bed eating!  No fever, dyspnea, chest pain, abdominal pain, N/V/D, dysuria.  Only complained of leg pain.  The "inner cannula" of pt's trach was missing, but replaced, and pt said it felt better now.  Received 1u pRBC.   Objective: Vitals:   03/29/19 1212 03/29/19 1235 03/29/19 1423 03/29/19 1608  BP: 114/61 115/66 120/67 115/70  Pulse: 70 70 70 69  Resp: 20 20 20 20   Temp: 98.4 F (36.9 C) 97.7 F (36.5 C) 98.6 F (37 C) 97.9 F (36.6 C)  TempSrc: Oral Oral  Oral  SpO2: 93% 97% 100% 100%  Weight:      Height:        Intake/Output Summary (Last 24 hours) at 03/29/2019 1927 Last data filed at 03/29/2019 1621 Gross per 24 hour  Intake 837.71 ml  Output --  Net 837.71 ml   Filed Weights   03/27/19 1025  Weight: 90.7 kg    Examination:   Constitutional: NAD, oriented, much more alert and awake this  morning, sitting at side of bed eating. HEENT: conjunctivae and lids normal, EOMI.  Scalp laceration over top left CV: RRR no M,R,G. Distal pulses +2.  No cyanosis.   RESP: coarse breathing sounds, normal respiratory effort, with trach collar, on RA GI: +BS, NTND Extremities: BLE with ACE wrap SKIN: warm.  Extensive bruising on UE's.   Neuro: II - XII grossly intact.  Sensation intact Psych: better mood and affect.     Data Reviewed: I have personally reviewed following labs and imaging studies  CBC: Recent Labs  Lab 03/27/19 1154 03/28/19 0550 03/29/19 0439  WBC 10.7* 7.7 8.1  NEUTROABS 8.7*  --   --   HGB  8.2* 7.3* 7.0*  HCT 30.5* 27.4* 26.3*  MCV 75.9* 75.5* 76.5*  PLT 160 156 157   Basic Metabolic Panel: Recent Labs  Lab 03/27/19 1401 03/28/19 0550 03/29/19 0439  NA 136 140 138  K 5.5* 5.6* 5.2*  CL 101 105 105  CO2 22 27 26   GLUCOSE 191* 110* 123*  BUN 129* 114* 99*  CREATININE 2.65* 1.96* 1.74*  CALCIUM 9.2 9.2 8.8*  MG  --  2.0 1.9   GFR: Estimated Creatinine Clearance: 44 mL/min (A) (by C-G formula based on SCr of 1.74 mg/dL (H)). Liver Function Tests: Recent Labs  Lab 03/27/19 1401  AST 35  ALT 16  ALKPHOS 77  BILITOT 1.6*  PROT 8.1  ALBUMIN 3.1*   No results for input(s): LIPASE, AMYLASE in the last 168 hours. No results for input(s): AMMONIA in the last 168 hours. Coagulation Profile: No results for input(s): INR, PROTIME in the last 168 hours. Cardiac Enzymes: No results for input(s): CKTOTAL, CKMB, CKMBINDEX, TROPONINI in the last 168 hours. BNP (last 3 results) No results for input(s): PROBNP in the last 8760 hours. HbA1C: Recent Labs    03/27/19 1401  HGBA1C 7.3*   CBG: No results for input(s): GLUCAP in the last 168 hours. Lipid Profile: Recent Labs    03/27/19 1401  CHOL 79  HDL 29*  LDLCALC 43  TRIG 36  CHOLHDL 2.7   Thyroid Function Tests: Recent Labs    03/27/19 1401  TSH 4.835*   Anemia Panel: Recent Labs    03/27/19 1401 03/28/19 0853  TIBC 441  --   IRON 18*  --   RETICCTPCT  --  2.7   Sepsis Labs: No results for input(s): PROCALCITON, LATICACIDVEN in the last 168 hours.  Recent Results (from the past 240 hour(s))  SARS CORONAVIRUS 2 (TAT 6-24 HRS) Nasopharyngeal Nasopharyngeal Swab     Status: None   Collection Time: 03/27/19 11:54 AM   Specimen: Nasopharyngeal Swab  Result Value Ref Range Status   SARS Coronavirus 2 NEGATIVE NEGATIVE Final    Comment: (NOTE) SARS-CoV-2 target nucleic acids are NOT DETECTED. The SARS-CoV-2 RNA is generally detectable in upper and lower respiratory specimens during the acute  phase of infection. Negative results do not preclude SARS-CoV-2 infection, do not rule out co-infections with other pathogens, and should not be used as the sole basis for treatment or other patient management decisions. Negative results must be combined with clinical observations, patient history, and epidemiological information. The expected result is Negative. Fact Sheet for Patients: 03/29/19 Fact Sheet for Healthcare Providers: HairSlick.no This test is not yet approved or cleared by the quierodirigir.com FDA and  has been authorized for detection and/or diagnosis of SARS-CoV-2 by FDA under an Emergency Use Authorization (EUA). This EUA will remain  in effect (meaning this  test can be used) for the duration of the COVID-19 declaration under Section 56 4(b)(1) of the Act, 21 U.S.C. section 360bbb-3(b)(1), unless the authorization is terminated or revoked sooner. Performed at [facility], [place] [address], [place], [place] [address]       Radiology Studies: No results found.   Scheduled Meds: . allopurinol  100 mg Oral Daily  . atorvastatin  80 mg Oral Nightly  . enoxaparin (LOVENOX) injection  40 mg Subcutaneous Q24H  . ferrous sulfate  324 mg Oral Q breakfast  . hydrocerin   Topical Once per day on Mon Thu  . lisinopril  2.5 mg Oral Daily  . metoprolol  100 mg Oral Daily  . mirtazapine  7.5 mg Oral QHS  . pantoprazole  40 mg Oral Daily   Continuous Infusions:   LOS: 2 days     Danny Bi, MD [facility] If 7PM-7AM, please contact night-coverage 03/29/2019, 7:27 PM

## 2019-03-29 NOTE — Progress Notes (Signed)
PT Cancellation Note  Patient Details Name: Danny Glover MRN: [id number] DOB: 10/23/1950   Cancelled Treatment:    Reason Eval/Treat Not Completed: Other (comment); Pt receiving blood transfusion, will attempt to see pt at a future date/time as medically appropriate.     Ovidio Hanger PT, DPT 03/29/19, 1:29 PM

## 2019-03-29 NOTE — Progress Notes (Signed)
Respiratory care informed this RN that they could not order pt a inner cannula, that emergency trach should be from cental supply not special ordered by RT RN received from central supply, emergency trach at bedside

## 2019-03-29 NOTE — Care Management Important Message (Addendum)
Important Message  Patient Details  Name: Danny Glover MRN: [id number] Date of Birth: 23-Sep-1950   Medicare Important Message Given:  Yes  Initial Medicare IM given by Patient Access Associate on 03/28/2019 at 1435.   [name] 03/29/2019, 8:40 AM

## 2019-03-29 NOTE — Evaluation (Signed)
Occupational Therapy Evaluation Patient Details Name: Danny Glover MRN: [id number] DOB: 1950/04/09 Today's Date: 03/29/2019    History of Present Illness Pt is 69 y/o M with PMH: sCHF with ICD, CAD s/p CABG, Afib on Eliquis, trach (chronic, since 2006), T2DM, and HTN. Pt presented after fall at home with dizziness and hit his head. Stroke w/u consisted of CT (unable to perform MRI d/t ICD) which revealed small, age-indeterminent infarct of R basal ganglia.   Clinical Impression   Pt was seen for OT evaluation this date. Prior to hospital admission, pt was MOD I with most aspects of self care and used 4WW for fxl mobility within the home and community. Pt was driving and getting his own groceries using motorized scooter. Pt lives in one level apartment with level entry. Currently pt demonstrates impairments as described below (See OT problem list) which functionally limit his ability to perform ADL/self-care tasks. Pt currently requires MIN A for rolling bed mobility, setup for seated UB g/h tasks and MAX A for LB ADLs d/t pain 2/2 sores. In addition, pt Is unable to participate in sitting upright and transfer trials on OT assessment as his Hgb is 7.0 and he is awaiting transfusion.  Pt demos significantly decreased fxl activity tolerance for bed level activity assessed. Pt would benefit from skilled OT to address noted impairments and functional limitations (see below for any additional details) in order to maximize safety and independence while minimizing falls risk and caregiver burden. Upon hospital discharge, recommend  D/c to SNF to encourage pt regain strength and independence for self care ADLs/ADL mobility.   Follow Up Recommendations   SNF   Equipment Recommendations    Shower chair   Recommendations for Other Services       Precautions / Restrictions Precautions Precautions: Fall Restrictions Weight Bearing Restrictions: No      Mobility Bed Mobility Overal bed mobility: Needs  Assistance Bed Mobility: Rolling Rolling: Min assist            Transfers                 General transfer comment: transfers deferred on OT assessment as pt with Hgb of 7.0 pending transfusion and only cleared for in-bed assessment at this time.    Balance                                           ADL either performed or assessed with clinical judgement   ADL Overall ADL's : Needs assistance/impaired Eating/Feeding: Set up;Bed level Eating/Feeding Details (indicate cue type and reason): with HOB elevated >60 degrees Grooming: Wash/dry face;Set up;Bed level Grooming Details (indicate cue type and reason): with HOB elevated >60 degrees         Upper Body Dressing : Minimal assistance;Bed level Upper Body Dressing Details (indicate cue type and reason): with HOB elevated Lower Body Dressing: Maximal assistance;Total assistance;Bed level Lower Body Dressing Details (indicate cue type and reason): with HOB flat using rolling technique   Toilet Transfer Details (indicate cue type and reason): unable to assess fxl transfer at this time as pt with Hgb 7.0 pending transfusion. RN cleared for in bed assessment.                 Vision Patient Visual Report: No change from baseline Additional Comments: pt states some blurriness initially after fall, but seems to be back to  his normal at this time. Pt noted to track appropriately throughout OT assesment. Some instances of eye closing d/t fatigue on assessment.     Perception     Praxis      Pertinent Vitals/Pain Pain Assessment: 0-10 Pain Score: 6  Pain Location: head/LEs. Pain Descriptors / Indicators: Sore;Discomfort Pain Intervention(s): Limited activity within patient's tolerance;Monitored during session     Hand Dominance     Extremity/Trunk Assessment Upper Extremity Assessment Upper Extremity Assessment: RUE deficits/detail;LUE deficits/detail RUE Deficits / Details: shoulder, elbow,  grip 4/5 LUE Deficits / Details: shoulder, elbow, grip 4/5   Lower Extremity Assessment Lower Extremity Assessment: Defer to PT evaluation;Generalized weakness       Communication Communication Communication: Tracheostomy(Pt able to communicate well despite trach, but some difficulty today as inner cannula is misplaced, RN aware and okay'ed in bed assessment.)   Cognition Arousal/Alertness: Awake/alert Behavior During Therapy: WFL for tasks assessed/performed Overall Cognitive Status: Within Functional Limits for tasks assessed                                 General Comments: somewhat sleepy, but A&O and appropriate with all commands   General Comments       Exercises Other Exercises Other Exercises: OT facilitates education re: role of OT in acute setting with pt verbalizing understanding. Other Exercises: OT facilitates education re: d/c recommendations: STR to regain strength/tolerance. Pt agreeable.   Shoulder Instructions      Home Living Family/patient expects to be discharged to:: Private residence Living Arrangements: Alone Available Help at Discharge: Personal care attendant(2 days/week for dressing changes.) Type of Home: Apartment Home Access: Level entry     Home Layout: One level     Bathroom Shower/Tub: Tub/shower unit         Home Equipment: Environmental consultant - 4 wheels;Bedside commode;Grab bars - tub/shower;Other (comment)(has reacher, has BSC over standard commode in restroom)          Prior Functioning/Environment Level of Independence: Independent with assistive device(s)        Comments: Pt reports being ambulatory in home and community with [phone]. Pt states he is able to bathe and dress himself although these are taxing tasks and take extended time to complete.        OT Problem List: Decreased strength;Decreased activity tolerance;Impaired balance (sitting and/or standing);Cardiopulmonary status limiting activity      OT  Treatment/Interventions:      OT Goals(Current goals can be found in the care plan section) Acute Rehab OT Goals Patient Stated Goal: to get to rehab OT Goal Formulation: With patient Time For Goal Achievement: 04/12/19 Potential to Achieve Goals: Good    OT Frequency:  1x/wk   Barriers to D/C:  decreased caregiver support          Co-evaluation              AM-PAC OT "6 Clicks" Daily Activity     Outcome Measure Help from another person eating meals?: None Help from another person taking care of personal grooming?: None Help from another person toileting, which includes using toliet, bedpan, or urinal?: A Lot Help from another person bathing (including washing, rinsing, drying)?: A Lot Help from another person to put on and taking off regular upper body clothing?: A Little Help from another person to put on and taking off regular lower body clothing?: A Lot 6 Click Score: 17   End of Session  Activity Tolerance: Patient limited by fatigue;Treatment limited secondary to medical complications (Comment)(unable to assess t/f on OT evaluation d/t Hgb 7.0 w/ pending transfusion.) Patient left: in bed;with call bell/phone within reach;with bed alarm set  OT Visit Diagnosis: Muscle weakness (generalized) (M62.81);History of falling (Z91.81)                Time: 0941-1000 OT Time Calculation (min): 19 min Charges:  OT General Charges $OT Visit: 1 Visit OT Evaluation $OT Eval Moderate Complexity: 1 Mod OT Treatments $Therapeutic Activity: 8-22 mins  Gerrianne Scale, MS, OTR/L ascom [phone] 03/29/19, 12:51 PM

## 2019-03-29 NOTE — Progress Notes (Signed)
RN called RT to room to assess status of patients jackson tracheostomy tube. She was having difficulty passing suction catheter. This RT was able to pass a #10 french catheter and suction patient but there was some difficult passing the catheter. Patient has no inner cannula and states that he had lost it. RT had placed a #4 cuffed shiley and a #6 ET at patients bedside for emergent use if needed. RN and RT discussed the issue of patient not having an inner cannula and the difficulty passing the suction catheter. It was felt by both RN and RT that a Consult to ENT was needed to assess the patients airway patency. RN messaged hospitalist and spoke with them concerning the airway issues and the hospitalist felt an ENT consult was needed as well.

## 2019-03-29 NOTE — Consult Note (Addendum)
Reason for Consult:Falls, abnormal CT Referring Physician: Fran Lowes  CC: Falls, abnormal CT  HPI: Danny Glover is an 69 y.o. male with medical history significant of sCHF with ICD, CAD s/p CABG, Afib on Eliquis, tracheostomy, DM2, HTN who presented after falling and hitting his head.  Patient reports that he has fallen 3 times in the past week.  It was only this last times that he had some associated dizziness.  Has had falls in the past but they have not been this frequent.   Has had imbalance for at least a year. At baseline, pt walks with a walker, lives at home alone with a HH aid.  Has chronic LE swelling, and painful superficial ulcers on both lower legs.    Past Medical History:  Diagnosis Date  . CHF (congestive heart failure) (HCC)   . Diabetes mellitus without complication [facility])     Surgical history: CABG, AICD placement  Family History  Problem Relation Age of Onset  . Hypertension Mother   . Heart attack Father     Social History:  reports that he has been smoking cigarettes. He has been smoking about 1.00 pack per day. He has never used smokeless tobacco. He reports that he does not drink alcohol or use drugs.  No Known Allergies  Medications:  I have reviewed the patient's current medications. Prior to Admission:  Medications Prior to Admission  Medication Sig Dispense Refill Last Dose  . acetaminophen (TYLENOL) 325 MG tablet Take 650 mg by mouth every 6 (six) hours as needed for mild pain.   prn at prn  . allopurinol (ZYLOPRIM) 100 MG tablet Take 100 mg by mouth daily.   03/26/2019 at 0800  . apixaban (ELIQUIS) 5 MG TABS tablet Take 5 mg by mouth every 12 (twelve) hours.   03/26/2019 at 0800  . atorvastatin (LIPITOR) 80 MG tablet Take 40 mg by mouth at bedtime.    03/26/2019 at 2000  . cholecalciferol (VITAMIN D) 25 MCG (1000 UNIT) tablet Take 5,000 Units by mouth daily.   03/26/2019 at 0800  . docusate sodium (COLACE) 100 MG capsule Take 100 mg by mouth daily as needed for  mild constipation.   prn at prn  . ferrous sulfate 324 MG TBEC Take 324 mg by mouth.   03/26/2019 at 0800  . gabapentin (NEURONTIN) 100 MG capsule Take 100 mg by mouth every 12 (twelve) hours.   03/26/2019 at 2000  . lisinopril (ZESTRIL) 5 MG tablet Take 2.5 mg by mouth daily.   03/26/2019 at 0800  . metolazone (ZAROXOLYN) 2.5 MG tablet Take 2.5 mg by mouth daily as needed (fluid).   03/26/2019 at 0800  . metoprolol (TOPROL-XL) 200 MG 24 hr tablet Take 100 mg by mouth daily.   03/26/2019 at 0800  . mirtazapine (REMERON) 7.5 MG tablet Take 7.5 mg by mouth at bedtime.   03/26/2019 at 2000  . Multiple Vitamin (MULTIVITAMIN) tablet Take 1 tablet by mouth daily.   03/26/2019 at 0800  . pantoprazole (PROTONIX) 40 MG tablet Take 40 mg by mouth daily.   03/26/2019 at 0800  . torsemide (DEMADEX) 20 MG tablet Take 60 mg by mouth daily. May take 40 mg extra if needed   03/26/2019 at 0800   Scheduled: . sodium chloride   Intravenous Once  . allopurinol  100 mg Oral Daily  . atorvastatin  80 mg Oral Nightly  . enoxaparin (LOVENOX) injection  40 mg Subcutaneous Q24H  . ferrous sulfate  324 mg Oral Q breakfast  .  gabapentin  100 mg Oral Q12H  . hydrocerin   Topical Once per day on Mon Thu  . lisinopril  2.5 mg Oral Daily  . metoprolol  100 mg Oral Daily  . mirtazapine  7.5 mg Oral QHS  . pantoprazole  40 mg Oral Daily    ROS: History obtained from the patient  General ROS: negative for - chills, fatigue, fever, night sweats, weight gain or weight loss Psychological ROS: negative for - behavioral disorder, hallucinations, memory difficulties, mood swings or suicidal ideation Ophthalmic ROS: negative for - blurry vision, double vision, eye pain or loss of vision ENT ROS: negative for - epistaxis, nasal discharge, oral lesions, sore throat, tinnitus or vertigo Allergy and Immunology ROS: negative for - hives or itchy/watery eyes Hematological and Lymphatic ROS: negative for - bleeding problems, bruising or  swollen lymph nodes Endocrine ROS: negative for - galactorrhea, hair pattern changes, polydipsia/polyuria or temperature intolerance Respiratory ROS: shortness of breath Cardiovascular ROS: LE edema Gastrointestinal ROS: negative for - abdominal pain, diarrhea, hematemesis, nausea/vomiting or stool incontinence Genito-Urinary ROS: negative for - dysuria, hematuria, incontinence or urinary frequency/urgency Musculoskeletal ROS: LE pain Neurological ROS: as noted in HPI Dermatological ROS: negative for rash and skin lesion changes  Physical Examination: Blood pressure 133/87, pulse 72, temperature (!) 97.3 F (36.3 C), temperature source Oral, resp. rate (!) 22, height 5\' 9"  (1.753 m), weight 90.7 kg, SpO2 97 %.  HEENT-  Hematoma on back of head.  Normal external eye and conjunctiva.  Normal TM's bilaterally.  Normal auditory canals and external ears. Normal external nose, mucus membranes and septum.   Cardiovascular- S1, S2 normal, pulses palpable throughout   Lungs- + wheezes Abdomen- soft, non-tender; bowel sounds normal; no masses,  no organomegaly Extremities- LE bandaging Lymph-no adenopathy palpable Musculoskeletal-no joint tenderness, deformity or swelling Skin-brusing noted  Neurological Examination   Mental Status: Alert, oriented, thought content appropriate.  Speech fluent without evidence of aphasia.  Able to follow 3 step commands without difficulty. Cranial Nerves: II: Visual fields grossly normal, pupils equal, round, reactive to light and accommodation III,IV, VI: ptosis not present, extra-ocular motions intact bilaterally V,VII: smile symmetric, facial light touch sensation normal bilaterally VIII: hearing normal bilaterally IX,X: gag reflex present XI: bilateral shoulder shrug XII: midline tongue extension Motor: Right : Upper extremity   5/5    Left:     Upper extremity   5/5  Lower extremity   5/5     Lower extremity   5/5 Myoclonus noted in the upper  extremities Sensory: Pinprick and light touch intact throughout, bilaterally Deep Tendon Reflexes: Symmetric in the upper extremities. Unable to test is the lower extremities due to bandaging Plantars: Right: mute   Left: mute Cerebellar: No dysmetria noted with finger to nose testing Gait: not tested due to safety concerns  Laboratory Studies:   Basic Metabolic Panel: Recent Labs  Lab 03/27/19 1401 03/28/19 0550 03/29/19 0439  NA 136 140 138  K 5.5* 5.6* 5.2*  CL 101 105 105  CO2 22 27 26   GLUCOSE 191* 110* 123*  BUN 129* 114* 99*  CREATININE 2.65* 1.96* 1.74*  CALCIUM 9.2 9.2 8.8*  MG  --  2.0 1.9    Liver Function Tests: Recent Labs  Lab 03/27/19 1401  AST 35  ALT 16  ALKPHOS 77  BILITOT 1.6*  PROT 8.1  ALBUMIN 3.1*   No results for input(s): LIPASE, AMYLASE in the last 168 hours. No results for input(s): AMMONIA in the last 168  hours.  CBC: Recent Labs  Lab 03/27/19 1154 03/28/19 0550 03/29/19 0439  WBC 10.7* 7.7 8.1  NEUTROABS 8.7*  --   --   HGB 8.2* 7.3* 7.0*  HCT 30.5* 27.4* 26.3*  MCV 75.9* 75.5* 76.5*  PLT 160 156 157    Cardiac Enzymes: No results for input(s): CKTOTAL, CKMB, CKMBINDEX, TROPONINI in the last 168 hours.  BNP: Invalid input(s): POCBNP  CBG: No results for input(s): GLUCAP in the last 168 hours.  Microbiology: Results for orders placed or performed during the hospital encounter of 03/27/19  SARS CORONAVIRUS 2 (TAT 6-24 HRS) Nasopharyngeal Nasopharyngeal Swab     Status: None   Collection Time: 03/27/19 11:54 AM   Specimen: Nasopharyngeal Swab  Result Value Ref Range Status   SARS Coronavirus 2 NEGATIVE NEGATIVE Final    Comment: (NOTE) SARS-CoV-2 target nucleic acids are NOT DETECTED. The SARS-CoV-2 RNA is generally detectable in upper and lower respiratory specimens during the acute phase of infection. Negative results do not preclude SARS-CoV-2 infection, do not rule out co-infections with other pathogens, and  should not be used as the sole basis for treatment or other patient management decisions. Negative results must be combined with clinical observations, patient history, and epidemiological information. The expected result is Negative. Fact Sheet for Patients: HairSlick.no Fact Sheet for Healthcare Providers: quierodirigir.com This test is not yet approved or cleared by the [place] FDA and  has been authorized for detection and/or diagnosis of SARS-CoV-2 by FDA under an Emergency Use Authorization (EUA). This EUA will remain  in effect (meaning this test can be used) for the duration of the COVID-19 declaration under Section 56 4(b)(1) of the Act, 21 U.S.C. section 360bbb-3(b)(1), unless the authorization is terminated or revoked sooner. Performed at [facility], [address], [place], [place] [address]     Coagulation Studies: No results for input(s): LABPROT, INR in the last 72 hours.  Urinalysis: No results for input(s): COLORURINE, LABSPEC, PHURINE, GLUCOSEU, HGBUR, BILIRUBINUR, KETONESUR, PROTEINUR, UROBILINOGEN, NITRITE, LEUKOCYTESUR in the last 168 hours.  Invalid input(s): APPERANCEUR  Lipid Panel:     Component Value Date/Time   CHOL 79 03/27/2019 1401   TRIG 36 03/27/2019 1401   HDL 29 (L) 03/27/2019 1401   CHOLHDL 2.7 03/27/2019 1401   VLDL 7 03/27/2019 1401   LDLCALC 43 03/27/2019 1401    HgbA1C:  Lab Results  Component Value Date   HGBA1C 7.3 (H) 03/27/2019    Urine Drug Screen:  No results found for: LABOPIA, COCAINSCRNUR, LABBENZ, AMPHETMU, THCU, LABBARB  Alcohol Level:  Recent Labs  Lab 03/27/19 1401  ETH <10    Other results: EKG: sinus rhythm at 70 bpm with RBBB.  Imaging: EXAM: CT HEAD WITHOUT CONTRAST  TECHNIQUE: Contiguous axial images were obtained from the base of the skull through the vertex without intravenous contrast.  COMPARISON:  None.  FINDINGS: Brain:  There is no acute intracranial hemorrhage, mass-effect, or edema. Gray-white differentiation is preserved. There is no extra-axial fluid collection. Ventricles and sulci are within normal limits in size and configuration. A small focus of hypoattenuation in the inferior right lentiform nucleus may reflect age-indeterminate small vessel infarct or prominent perivascular space.  Vascular: There is atherosclerotic calcification at the skull base.  Skull: Calvarium is unremarkable.  Sinuses/Orbits: No acute finding.  Other: Left posterior scalp hematoma.  IMPRESSION: No evidence of acute intracranial injury.  Possible small age-indeterminate infarct of the inferior right basal ganglia.  Assessment/Plan: 69 y.o. male with medical  history significant of sCHF with ICD, CAD s/p CABG, Afib on Eliquis, tracheostomy, DM2, HTN who presented after falling and hitting his head.  Patient reports that he has fallen 3 times in the past week.  It was only this last times that he had some associated dizziness.  Has had falls in the past but they have not been this frequent. Head CT performed in work up shows a possible age indeterminate right BG infarct.  Do not suspect that this is the cause of the patient's current falls although patient with history of afib, he is on Eliquis and reports compliance.  MRI of the brain unable to be performed due to AICD.  Patient does though have abnormal TSH, significant anemia and worsening renal insufficiency.  Myoclonus noted on neurological examination that is likely metabolic in origin as well.   LDL 43,  A1c 7.3  Recommendations: 1.  Agree with continued Eliquis 2.  Agree with addressing medical issues 3.  Orthostatic vitals 4.  ABG to rule out CO2 retention 5.  Smoking cessation counseling 6.  Blood sugar management with target A1c<7.0 7. D/C Neurontin  Thana Farr, MD Neurology [phone] 03/29/2019, 11:33 AM

## 2019-03-30 LAB — CBC
HCT: 28.8 % — ABNORMAL LOW (ref 39.0–52.0)
Hemoglobin: 8.1 g/dL — ABNORMAL LOW (ref 13.0–17.0)
MCH: 21.7 pg — ABNORMAL LOW (ref 26.0–34.0)
MCHC: 28.1 g/dL — ABNORMAL LOW (ref 30.0–36.0)
MCV: 77 fL — ABNORMAL LOW (ref 80.0–100.0)
Platelets: 165 10*3/uL (ref 150–400)
RBC: 3.74 MIL/uL — ABNORMAL LOW (ref 4.22–5.81)
RDW: 22.2 % — ABNORMAL HIGH (ref 11.5–15.5)
WBC: 7.9 10*3/uL (ref 4.0–10.5)
nRBC: 0.5 % — ABNORMAL HIGH (ref 0.0–0.2)

## 2019-03-30 LAB — BASIC METABOLIC PANEL
Anion gap: 9 (ref 5–15)
BUN: 89 mg/dL — ABNORMAL HIGH (ref 8–23)
CO2: 24 mmol/L (ref 22–32)
Calcium: 8.8 mg/dL — ABNORMAL LOW (ref 8.9–10.3)
Chloride: 106 mmol/L (ref 98–111)
Creatinine, Ser: 1.83 mg/dL — ABNORMAL HIGH (ref 0.61–1.24)
GFR calc Af Amer: 42 mL/min — ABNORMAL LOW (ref >60)
GFR calc non Af Amer: 37 mL/min — ABNORMAL LOW (ref >60)
Glucose, Bld: 137 mg/dL — ABNORMAL HIGH (ref 70–99)
Potassium: 5.3 mmol/L — ABNORMAL HIGH (ref 3.5–5.1)
Sodium: 139 mmol/L (ref 135–145)

## 2019-03-30 LAB — BPAM RBC
Blood Product Expiration Date: 202101292359
ISSUE DATE / TIME: 202101221214
Unit Type and Rh: 600

## 2019-03-30 LAB — MAGNESIUM: Magnesium: 1.8 mg/dL (ref 1.7–2.4)

## 2019-03-30 LAB — TYPE AND SCREEN
ABO/RH(D): A NEG
Antibody Screen: NEGATIVE
Unit division: 0

## 2019-03-30 MED ORDER — SODIUM CHLORIDE 0.9 % IV SOLN
200.0000 mg | INTRAVENOUS | Status: AC
  Administered 2019-03-30 – 2019-04-01 (×3): 200 mg via INTRAVENOUS
  Filled 2019-03-30 (×3): qty 10

## 2019-03-30 NOTE — Progress Notes (Signed)
PROGRESS NOTE    Danny Glover  TFT:[id number] DOB: 1950-08-05 DOA: 03/27/2019 PCP: System, Pcp Not In    Assessment & Plan:   Active Problems:   Falls frequently    Danny Glover is a 69 y.o. Caucasian male with medical history significant of sCHF with ICD, CAD s/p CABG, Afib on Eliquis, tracheostomy, DM2, HTN who presented after falling and hitting his head.   # Frequent falls --frequent falls in recent days, with dizziness and imbalance prior.  Had sustained scalp laceration.  No report of associated syncope.  PCP had noted decline. PLAN: --PT/OT rec SNF  --Stroke workup as below  # Possible small age-indeterminate infarct of the inferior right basal ganglia --As seen on head CT. --A1c 7.3, LDL 43, TSH 4.8  --Neurology consulted.  No MRI brain since pt has an ICD PLAN: --Lipitor 80 mg daily --PT/OT (pt denied swallowing problems) --smoking cessation  # Chronic venous stasis ulcers in BLE --Does not appear to be cellulitis --Wound care consulted, ACE wrap applied.  # CKD # AKI, improved --Cr 2.65 on presentation, 1.54 in May 2020.  AKI likely pre-renal from poor PO intake and hydration. --s/p NS 500 ml in the ED and LR@100  for 10 hours per day for 2 days PLAN: --Hold further IVF  --Encourage PO hydration  # Hyperkalemia --Pt refused Kayexalate --order Lokelma instead   # Microcytic anemia 2/2 iron def --iron profile showed severe iron def.  Retic elevated at 32%, LDH wnl.  Pt denied signs of GI bleed.  Does not tolerate oral iron supplement. --1u pRBC  PLAN: --transfuse if Hgb <7 --Start IV iron 200 mg daily x3 doses  # Chronic systolic CHF LVEF 70% with BiV-ICD # Hx of CAD s/p CABG # HTN --continue home metop and Lisinopril --Hold home torsemide due to AKI  # Afib on Eliquis --continue home metop --Hold Eliquis for now since Hgb is trending down  # s/p tracheostomy in 2006  # DM2 --no diabetic meds on med list --A1c 7.3 --No need fo  fingersticks or insulin   # HLD --continue home statin  # Hx of gout --continue home allopurinol  # GERD --continue home PI   DVT prophylaxis: Lovenox SQ Code Status: Full code  Family Communication: updated pt's daughter on the phone today  [facility]   Subjective and Interval History:  Pt reported feeling better.  Only complained of leg pain again.  Ate well.  No fever, dyspnea, chest pain, abdominal pain, N/V/D, dysuria, increased swelling.   Objective: Vitals:   03/29/19 1608 03/29/19 2220 03/30/19 0044 03/30/19 0830  BP: 115/70  (!) 115/54 136/68  Pulse: 69  70 70  Resp: 20  18   Temp: 97.9 F (36.6 C)  98 F (36.7 C) (!) 97.3 F (36.3 C)  TempSrc: Oral  Oral Oral  SpO2: 100% 98% 96% 99%  Weight:      Height:        Intake/Output Summary (Last 24 hours) at 03/30/2019 1928 Last data filed at 03/30/2019 0947 Gross per 24 hour  Intake 102.53 ml  Output --  Net 102.53 ml   Filed Weights   03/27/19 1025  Weight: 90.7 kg    Examination:   Constitutional: NAD, oriented, much more alert and awake, sitting up in a chair. HEENT: conjunctivae and lids normal, EOMI.  CV: RRR no M,R,G. Distal pulses +2.  No cyanosis.   RESP: coarse and gurgling breathing sounds, normal respiratory effort,  with trach collar, on RA GI: +BS, NTND Extremities: BLE with ACE wrap SKIN: warm.  Extensive bruising on UE's.   Neuro: II - XII grossly intact.  Sensation intact Psych: better mood and affect.     Data Reviewed: I have personally reviewed following labs and imaging studies  CBC: Recent Labs  Lab 03/27/19 1154 03/28/19 0550 03/29/19 0439 03/30/19 0551  WBC 10.7* 7.7 8.1 7.9  NEUTROABS 8.7*  --   --   --   HGB 8.2* 7.3* 7.0* 8.1*  HCT 30.5* 27.4* 26.3* 28.8*  MCV 75.9* 75.5* 76.5* 77.0*  PLT 160 156 157 165   Basic Metabolic Panel: Recent Labs  Lab 03/27/19 1401 03/28/19 0550 03/29/19 0439 03/30/19 0551  NA 136 140 138  139  K 5.5* 5.6* 5.2* 5.3*  CL 101 105 105 106  CO2 22 27 26 24   GLUCOSE 191* 110* 123* 137*  BUN 129* 114* 99* 89*  CREATININE 2.65* 1.96* 1.74* 1.83*  CALCIUM 9.2 9.2 8.8* 8.8*  MG  --  2.0 1.9 1.8   GFR: Estimated Creatinine Clearance: 41.8 mL/min (A) (by C-G formula based on SCr of 1.83 mg/dL (H)). Liver Function Tests: Recent Labs  Lab 03/27/19 1401  AST 35  ALT 16  ALKPHOS 77  BILITOT 1.6*  PROT 8.1  ALBUMIN 3.1*   No results for input(s): LIPASE, AMYLASE in the last 168 hours. No results for input(s): AMMONIA in the last 168 hours. Coagulation Profile: No results for input(s): INR, PROTIME in the last 168 hours. Cardiac Enzymes: No results for input(s): CKTOTAL, CKMB, CKMBINDEX, TROPONINI in the last 168 hours. BNP (last 3 results) No results for input(s): PROBNP in the last 8760 hours. HbA1C: No results for input(s): HGBA1C in the last 72 hours. CBG: No results for input(s): GLUCAP in the last 168 hours. Lipid Profile: No results for input(s): CHOL, HDL, LDLCALC, TRIG, CHOLHDL, LDLDIRECT in the last 72 hours. Thyroid Function Tests: No results for input(s): TSH, T4TOTAL, FREET4, T3FREE, THYROIDAB in the last 72 hours. Anemia Panel: Recent Labs    03/28/19 0853  RETICCTPCT 2.7   Sepsis Labs: No results for input(s): PROCALCITON, LATICACIDVEN in the last 168 hours.  Recent Results (from the past 240 hour(s))  SARS CORONAVIRUS 2 (TAT 6-24 HRS) Nasopharyngeal Nasopharyngeal Swab     Status: None   Collection Time: 03/27/19 11:54 AM   Specimen: Nasopharyngeal Swab  Result Value Ref Range Status   SARS Coronavirus 2 NEGATIVE NEGATIVE Final    Comment: (NOTE) SARS-CoV-2 target nucleic acids are NOT DETECTED. The SARS-CoV-2 RNA is generally detectable in upper and lower respiratory specimens during the acute phase of infection. Negative results do not preclude SARS-CoV-2 infection, do not rule out co-infections with other pathogens, and should not be used as  the sole basis for treatment or other patient management decisions. Negative results must be combined with clinical observations, patient history, and epidemiological information. The expected result is Negative. Fact Sheet for Patients: 03/29/19 Fact Sheet for Healthcare Providers: HairSlick.no This test is not yet approved or cleared by the [place] FDA and  has been authorized for detection and/or diagnosis of SARS-CoV-2 by FDA under an Emergency Use Authorization (EUA). This EUA will remain  in effect (meaning this test can be used) for the duration of the COVID-19 declaration under Section 56 4(b)(1) of the Act, 21 U.S.C. section 360bbb-3(b)(1), unless the authorization is terminated or revoked sooner. Performed at [facility], [address] [place], [place]  Radiology Studies: No results found.   Scheduled Meds: . allopurinol  100 mg Oral Daily  . atorvastatin  80 mg Oral Nightly  . enoxaparin (LOVENOX) injection  40 mg Subcutaneous Q24H  . ferrous sulfate  324 mg Oral Q breakfast  . hydrocerin   Topical Once per day on Mon Thu  . lisinopril  2.5 mg Oral Daily  . metoprolol  100 mg Oral Daily  . mirtazapine  7.5 mg Oral QHS  . pantoprazole  40 mg Oral Daily   Continuous Infusions: . iron sucrose Stopped (03/30/19 0947)     LOS: 3 days     Darlin Priestly, MD [facility] If 7PM-7AM, please contact night-coverage 03/30/2019, 7:28 PM

## 2019-03-31 LAB — BASIC METABOLIC PANEL
Anion gap: 8 (ref 5–15)
BUN: 76 mg/dL — ABNORMAL HIGH (ref 8–23)
CO2: 26 mmol/L (ref 22–32)
Calcium: 9 mg/dL (ref 8.9–10.3)
Chloride: 104 mmol/L (ref 98–111)
Creatinine, Ser: 1.74 mg/dL — ABNORMAL HIGH (ref 0.61–1.24)
GFR calc Af Amer: 45 mL/min — ABNORMAL LOW (ref >60)
GFR calc non Af Amer: 39 mL/min — ABNORMAL LOW (ref >60)
Glucose, Bld: 115 mg/dL — ABNORMAL HIGH (ref 70–99)
Potassium: 4.9 mmol/L (ref 3.5–5.1)
Sodium: 138 mmol/L (ref 135–145)

## 2019-03-31 LAB — CBC
HCT: 32.6 % — ABNORMAL LOW (ref 39.0–52.0)
Hemoglobin: 9 g/dL — ABNORMAL LOW (ref 13.0–17.0)
MCH: 21.8 pg — ABNORMAL LOW (ref 26.0–34.0)
MCHC: 27.6 g/dL — ABNORMAL LOW (ref 30.0–36.0)
MCV: 78.9 fL — ABNORMAL LOW (ref 80.0–100.0)
Platelets: 180 10*3/uL (ref 150–400)
RBC: 4.13 MIL/uL — ABNORMAL LOW (ref 4.22–5.81)
RDW: 23.2 % — ABNORMAL HIGH (ref 11.5–15.5)
WBC: 9 10*3/uL (ref 4.0–10.5)
nRBC: 0.4 % — ABNORMAL HIGH (ref 0.0–0.2)

## 2019-03-31 LAB — MAGNESIUM: Magnesium: 1.8 mg/dL (ref 1.7–2.4)

## 2019-03-31 MED ORDER — POLYETHYLENE GLYCOL 3350 17 G PO PACK: 17.0000 g | PACK | Freq: Two times a day (BID) | ORAL | Status: DC | PRN

## 2019-03-31 MED ORDER — ALUM & MAG HYDROXIDE-SIMETH 200-200-20 MG/5ML PO SUSP: 15.0000 mL | Freq: Four times a day (QID) | ORAL | Status: DC | PRN

## 2019-03-31 MED ORDER — ONDANSETRON 4 MG PO TBDP
4.0000 mg | ORAL_TABLET | Freq: Three times a day (TID) | ORAL | Status: DC | PRN
  Filled 2019-03-31: qty 1

## 2019-03-31 MED ORDER — DOCUSATE SODIUM 100 MG PO CAPS: 100.0000 mg | ORAL_CAPSULE | Freq: Two times a day (BID) | ORAL | Status: DC | PRN

## 2019-03-31 MED ORDER — OXYCODONE HCL 5 MG PO TABS
5.0000 mg | ORAL_TABLET | Freq: Four times a day (QID) | ORAL | Status: DC | PRN
  Administered 2019-04-01 – 2019-04-03 (×7): 5 mg via ORAL
  Filled 2019-03-31 (×7): qty 1

## 2019-03-31 MED ORDER — ONDANSETRON HCL 4 MG/2ML IJ SOLN: 4.0000 mg | Freq: Four times a day (QID) | INTRAMUSCULAR | Status: DC | PRN

## 2019-03-31 MED ORDER — CALCIUM CARBONATE ANTACID 500 MG PO CHEW: 1.0000 | CHEWABLE_TABLET | Freq: Three times a day (TID) | ORAL | Status: DC | PRN

## 2019-03-31 MED ORDER — ACETAMINOPHEN 500 MG PO TABS
1000.0000 mg | ORAL_TABLET | Freq: Three times a day (TID) | ORAL | Status: DC | PRN
  Administered 2019-04-01: 1000 mg via ORAL
  Filled 2019-03-31: qty 2

## 2019-03-31 MED ORDER — GUAIFENESIN-DM 100-10 MG/5ML PO SYRP: 10.0000 mL | ORAL_SOLUTION | Freq: Four times a day (QID) | ORAL | Status: DC | PRN

## 2019-03-31 NOTE — Progress Notes (Signed)
PROGRESS NOTE    Danny Glover  ZOX:[id number] DOB: Jan 08, 1951 DOA: 03/27/2019 PCP: System, Pcp Not In    Assessment & Plan:   Active Problems:   Falls frequently    Danny Glover is a 69 y.o. Caucasian male with medical history significant of sCHF with ICD, CAD s/p CABG, Afib on Eliquis, tracheostomy, DM2, HTN who presented after falling and hitting his head.   # Frequent falls --frequent falls in recent days, with dizziness and imbalance prior.  Had sustained scalp laceration.  No report of associated syncope.  PCP had noted decline. PLAN: --PT/OT rec SNF  --Stroke workup as below  # Possible small age-indeterminate infarct of the inferior right basal ganglia --As seen on head CT. --A1c 7.3, LDL 43, TSH 4.8  --Neurology consulted.  No MRI brain since pt has an ICD PLAN: --Lipitor 80 mg daily --PT/OT (pt denied swallowing problems) --smoking cessation  # Chronic venous stasis ulcers in BLE --Does not appear to be cellulitis --Wound care consulted, ACE wrap applied.  # CKD # AKI, improved --Cr 2.65 on presentation, 1.54 in May 2020.  AKI likely pre-renal from poor PO intake and hydration. --s/p NS 500 ml in the ED and LR@100  for 10 hours per day for 2 days PLAN: --Hold further IVF  --Encourage PO hydration  # Hyperkalemia, resolved --Pt refused Kayexalate --order Lokelma instead, now d/c'ed.  # Microcytic anemia 2/2 iron def --iron profile showed severe iron def.  Retic elevated at 32%, LDH wnl.  Pt denied signs of GI bleed.  Does not tolerate oral iron supplement, per PCP.  Hx of gastric ulcer, per PCP, who will arrange outpatient workup. --1u pRBC  PLAN: --transfuse if Hgb <7 --IV iron 200 mg daily x3 doses --Outpatient GI workup, per PCP --Hold home Eliquis for now  # Chronic systolic CHF LVEF 81% with BiV-ICD # Hx of CAD s/p CABG # HTN --continue home metop and Lisinopril --Hold home torsemide due to AKI  # Afib on Eliquis --continue home  metop --Hold Eliquis for now since Hgb is trending down  # s/p tracheostomy in 2006  # DM2 --no diabetic meds on med list --A1c 7.3 --No need fo fingersticks or insulin   # HLD --continue home statin  # Hx of gout --continue home allopurinol  # GERD --continue home PI   DVT prophylaxis: Lovenox SQ Code Status: Full code  Family Communication: not today [facility]   Subjective and Interval History:  Pt continues to feel better, however, still complained of leg pain, and removed the ACE wrap from his right leg.  No fever, dyspnea, chest pain, abdominal pain, N/V/D, dysuria.  Ate well.   Objective: Vitals:   03/30/19 0044 03/30/19 0830 03/30/19 2356 03/31/19 1049  BP: (!) 115/54 136/68 129/62 (!) 101/49  Pulse: 70 70 70 69  Resp: 18  18 20   Temp: 98 F (36.7 C) (!) 97.3 F (36.3 C) 97.7 F (36.5 C) 98.6 F (37 C)  TempSrc: Oral Oral Oral   SpO2: 96% 99% 95% 95%  Weight:      Height:       No intake or output data in the 24 hours ending 03/31/19 1641 Filed Weights   03/27/19 1025  Weight: 90.7 kg    Examination:   Constitutional: NAD, oriented, much more alert and awake, sitting up in a chair. HEENT: conjunctivae and lids normal, EOMI.  CV: RRR no M,R,G. Distal pulses +2.  No cyanosis.  RESP: coarse and gurgling breathing sounds, normal respiratory effort, with trach collar, on RA GI: +BS, NTND Extremities: LLE with ACE wrap, wrap removed from right leg, showing a stained dressing and swollen lower leg. SKIN: warm.  Extensive bruising on UE's.   Neuro: II - XII grossly intact.  Sensation intact Psych: better mood and affect.     Data Reviewed: I have personally reviewed following labs and imaging studies  CBC: Recent Labs  Lab 03/27/19 1154 03/28/19 0550 03/29/19 0439 03/30/19 0551 03/31/19 0915  WBC 10.7* 7.7 8.1 7.9 9.0  NEUTROABS 8.7*  --   --   --   --   HGB 8.2* 7.3* 7.0* 8.1* 9.0*  HCT 30.5* 27.4*  26.3* 28.8* 32.6*  MCV 75.9* 75.5* 76.5* 77.0* 78.9*  PLT 160 156 157 165 180   Basic Metabolic Panel: Recent Labs  Lab 03/27/19 1401 03/28/19 0550 03/29/19 0439 03/30/19 0551 03/31/19 0915  NA 136 140 138 139 138  K 5.5* 5.6* 5.2* 5.3* 4.9  CL 101 105 105 106 104  CO2 22 27 26 24 26   GLUCOSE 191* 110* 123* 137* 115*  BUN 129* 114* 99* 89* 76*  CREATININE 2.65* 1.96* 1.74* 1.83* 1.74*  CALCIUM 9.2 9.2 8.8* 8.8* 9.0  MG  --  2.0 1.9 1.8 1.8   GFR: Estimated Creatinine Clearance: 44 mL/min (A) (by C-G formula based on SCr of 1.74 mg/dL (H)). Liver Function Tests: Recent Labs  Lab 03/27/19 1401  AST 35  ALT 16  ALKPHOS 77  BILITOT 1.6*  PROT 8.1  ALBUMIN 3.1*   No results for input(s): LIPASE, AMYLASE in the last 168 hours. No results for input(s): AMMONIA in the last 168 hours. Coagulation Profile: No results for input(s): INR, PROTIME in the last 168 hours. Cardiac Enzymes: No results for input(s): CKTOTAL, CKMB, CKMBINDEX, TROPONINI in the last 168 hours. BNP (last 3 results) No results for input(s): PROBNP in the last 8760 hours. HbA1C: No results for input(s): HGBA1C in the last 72 hours. CBG: No results for input(s): GLUCAP in the last 168 hours. Lipid Profile: No results for input(s): CHOL, HDL, LDLCALC, TRIG, CHOLHDL, LDLDIRECT in the last 72 hours. Thyroid Function Tests: No results for input(s): TSH, T4TOTAL, FREET4, T3FREE, THYROIDAB in the last 72 hours. Anemia Panel: No results for input(s): VITAMINB12, FOLATE, FERRITIN, TIBC, IRON, RETICCTPCT in the last 72 hours. Sepsis Labs: No results for input(s): PROCALCITON, LATICACIDVEN in the last 168 hours.  Recent Results (from the past 240 hour(s))  SARS CORONAVIRUS 2 (TAT 6-24 HRS) Nasopharyngeal Nasopharyngeal Swab     Status: None   Collection Time: 03/27/19 11:54 AM   Specimen: Nasopharyngeal Swab  Result Value Ref Range Status   SARS Coronavirus 2 NEGATIVE NEGATIVE Final    Comment:  (NOTE) SARS-CoV-2 target nucleic acids are NOT DETECTED. The SARS-CoV-2 RNA is generally detectable in upper and lower respiratory specimens during the acute phase of infection. Negative results do not preclude SARS-CoV-2 infection, do not rule out co-infections with other pathogens, and should not be used as the sole basis for treatment or other patient management decisions. Negative results must be combined with clinical observations, patient history, and epidemiological information. The expected result is Negative. Fact Sheet for Patients: 03/29/19 Fact Sheet for Healthcare Providers: HairSlick.no This test is not yet approved or cleared by the quierodirigir.com FDA and  has been authorized for detection and/or diagnosis of SARS-CoV-2 by FDA under an Emergency Use Authorization (EUA). This EUA will remain  in  effect (meaning this test can be used) for the duration of the COVID-19 declaration under Section 56 4(b)(1) of the Act, 21 U.S.C. section 360bbb-3(b)(1), unless the authorization is terminated or revoked sooner. Performed at [facility], [address], [place], [place] [address]       Radiology Studies: No results found.   Scheduled Meds: . allopurinol  100 mg Oral Daily  . atorvastatin  80 mg Oral Nightly  . enoxaparin (LOVENOX) injection  40 mg Subcutaneous Q24H  . ferrous sulfate  324 mg Oral Q breakfast  . hydrocerin   Topical Once per day on Mon Thu  . lisinopril  2.5 mg Oral Daily  . metoprolol  100 mg Oral Daily  . mirtazapine  7.5 mg Oral QHS  . pantoprazole  40 mg Oral Daily   Continuous Infusions: . iron sucrose 200 mg (03/31/19 1038)     LOS: 4 days     Darlin Priestly, MD [facility] If 7PM-7AM, please contact night-coverage 03/31/2019, 4:41 PM

## 2019-04-01 LAB — CBC
HCT: 31.9 % — ABNORMAL LOW (ref 39.0–52.0)
Hemoglobin: 8.6 g/dL — ABNORMAL LOW (ref 13.0–17.0)
MCH: 21.4 pg — ABNORMAL LOW (ref 26.0–34.0)
MCHC: 27 g/dL — ABNORMAL LOW (ref 30.0–36.0)
MCV: 79.4 fL — ABNORMAL LOW (ref 80.0–100.0)
Platelets: 169 10*3/uL (ref 150–400)
RBC: 4.02 MIL/uL — ABNORMAL LOW (ref 4.22–5.81)
RDW: 23.6 % — ABNORMAL HIGH (ref 11.5–15.5)
WBC: 9.8 10*3/uL (ref 4.0–10.5)
nRBC: 0.4 % — ABNORMAL HIGH (ref 0.0–0.2)

## 2019-04-01 LAB — BASIC METABOLIC PANEL
Anion gap: 8 (ref 5–15)
BUN: 62 mg/dL — ABNORMAL HIGH (ref 8–23)
CO2: 26 mmol/L (ref 22–32)
Calcium: 8.7 mg/dL — ABNORMAL LOW (ref 8.9–10.3)
Chloride: 106 mmol/L (ref 98–111)
Creatinine, Ser: 1.59 mg/dL — ABNORMAL HIGH (ref 0.61–1.24)
GFR calc Af Amer: 50 mL/min — ABNORMAL LOW (ref >60)
GFR calc non Af Amer: 43 mL/min — ABNORMAL LOW (ref >60)
Glucose, Bld: 138 mg/dL — ABNORMAL HIGH (ref 70–99)
Potassium: 4.8 mmol/L (ref 3.5–5.1)
Sodium: 140 mmol/L (ref 135–145)

## 2019-04-01 LAB — MAGNESIUM: Magnesium: 1.8 mg/dL (ref 1.7–2.4)

## 2019-04-01 MED ORDER — GABAPENTIN 100 MG PO CAPS
100.0000 mg | ORAL_CAPSULE | Freq: Two times a day (BID) | ORAL | Status: DC
  Administered 2019-04-01 – 2019-04-03 (×4): 100 mg via ORAL
  Filled 2019-04-01 (×4): qty 1

## 2019-04-01 NOTE — Care Management Important Message (Signed)
Important Message  Patient Details  Name: Danny Glover MRN: [id number] Date of Birth: [birth year]   Medicare Important Message Given:  Yes     [name] 04/01/2019, 11:20 AM

## 2019-04-01 NOTE — NC FL2 (Signed)
[place] MEDICAID FL2 LEVEL OF CARE SCREENING TOOL     IDENTIFICATION  Patient Name: Danny Glover Birthdate: 01-21-1951 Sex: male Admission Date (Current Location): 03/27/2019  [place] and [place] Number:  Chiropodist and Address:  [facility], [address], [place], [place] [address]      Provider Number: [id number]  Attending Physician Name and Address:  Darlin Priestly, MD  Relative Name and Phone Number:       Current Level of Care: Hospital Recommended Level of Care: Skilled Nursing Facility Prior Approval Number:    Date Approved/Denied:   PASRR Number: [id number]  Discharge Plan: SNF    Current Diagnoses: Patient Active Problem List   Diagnosis Date Noted  . Falls frequently 03/27/2019  . Paroxysmal atrial fibrillation (HCC) 01/03/2015  . Chronic obstructive pulmonary disease (HCC) 01/01/2015  . Chronic systolic (congestive) heart failure (HCC) 01/01/2015  . Controlled type 2 diabetes mellitus without complication (HCC) 01/01/2015  . Coronary artery disease involving native coronary artery 01/01/2015  . Essential hypertension 01/01/2015  . History of vocal cord paralysis 01/01/2015  . Hyperlipidemia 01/01/2015  . Tobacco abuse 01/01/2015  . Tracheostomy dependent (HCC) 01/01/2015  . Biventricular ICD (implantable cardioverter-defibrillator) in place 09/01/2014    Orientation RESPIRATION BLADDER Height & Weight     Self, Time, Situation, Place  Normal, Tracheostomy(Trach since 2006: 4 mm uncuffed shiley.) Continent Weight: 200 lb (90.7 kg) Height:  5\' 9"  (175.3 cm)  BEHAVIORAL SYMPTOMS/MOOD NEUROLOGICAL BOWEL NUTRITION STATUS  (None) (None) Continent Diet(Regular)  AMBULATORY STATUS COMMUNICATION OF NEEDS Skin   Limited Assist Verbally Skin abrasions, Bruising, Other (Comment)(MASD, Weeping. Venous stasis ulcers on right posterior leg (Gauze and coban) and left leg (Gauze and coban).)                       Personal  Care Assistance Level of Assistance  Bathing, Feeding, Dressing Bathing Assistance: Maximum assistance Feeding assistance: Limited assistance Dressing Assistance: Maximum assistance     Functional Limitations Info  Sight, Hearing, Speech Sight Info: Adequate Hearing Info: Adequate Speech Info: Adequate    SPECIAL CARE FACTORS FREQUENCY  PT (By licensed PT), OT (By licensed OT)     PT Frequency: 5 x week OT Frequency: 5 x week            Contractures Contractures Info: Not present    Additional Factors Info  Code Status, Allergies Code Status Info: Full code Allergies Info: NKDA           Current Medications (04/01/2019):  This is the current hospital active medication list Current Facility-Administered Medications  Medication Dose Route Frequency Provider Last Rate Last Admin  . acetaminophen (TYLENOL) tablet 1,000 mg  1,000 mg Oral Q8H PRN 04/03/2019, MD      . allopurinol (ZYLOPRIM) tablet 100 mg  100 mg Oral Daily Darlin Priestly, MD   100 mg at 04/01/19 0929  . alum & mag hydroxide-simeth (MAALOX/MYLANTA) 200-200-20 MG/5ML suspension 15 mL  15 mL Oral Q6H PRN 09-13-2000, MD      . atorvastatin (LIPITOR) tablet 80 mg  80 mg Oral Nightly Darlin Priestly, MD   80 mg at 03/31/19 2113  . calcium carbonate (TUMS - dosed in mg elemental calcium) chewable tablet 200 mg of elemental calcium  1 tablet Oral TID PRN 2114, MD      . docusate sodium (COLACE) capsule 100 mg  100 mg Oral BID PRN Darlin Priestly, MD      .  enoxaparin (LOVENOX) injection 40 mg  40 mg Subcutaneous Q24H Enzo Bi, MD   40 mg at 03/31/19 2112  . ferrous sulfate tablet 324 mg  324 mg Oral Q breakfast Enzo Bi, MD   324 mg at 04/01/19 0929  . guaiFENesin-dextromethorphan (ROBITUSSIN DM) 100-10 MG/5ML syrup 10 mL  10 mL Oral Q6H PRN Enzo Bi, MD      . hydrocerin (EUCERIN) cream   Topical Once per day on Mon Thu Lai, Tina, MD   Given at 04/01/19 1100  . lisinopril (ZESTRIL) tablet 2.5 mg  2.5 mg Oral Daily Enzo Bi, MD    2.5 mg at 04/01/19 0929  . metoprolol succinate (TOPROL-XL) 24 hr tablet 100 mg  100 mg Oral Daily Enzo Bi, MD   100 mg at 04/01/19 4174  . mirtazapine (REMERON) tablet 7.5 mg  7.5 mg Oral QHS Enzo Bi, MD   7.5 mg at 03/31/19 2113  . ondansetron (ZOFRAN) injection 4 mg  4 mg Intravenous Q6H PRN Enzo Bi, MD      . ondansetron (ZOFRAN-ODT) disintegrating tablet 4 mg  4 mg Oral Q8H PRN Enzo Bi, MD      . oxyCODONE (Oxy IR/ROXICODONE) immediate release tablet 5 mg  5 mg Oral Q6H PRN Enzo Bi, MD   5 mg at 04/01/19 0940  . pantoprazole (PROTONIX) EC tablet 40 mg  40 mg Oral Daily Enzo Bi, MD   40 mg at 04/01/19 0929  . polyethylene glycol (MIRALAX / GLYCOLAX) packet 17 g  17 g Oral BID PRN Enzo Bi, MD         Discharge Medications: Please see discharge summary for a list of discharge medications.  Relevant Imaging Results:  Relevant Lab Results:   Additional Information SS#: [id number]. Per daughter, plan for LTC placement after rehab. [facility].  Candie Chroman, LCSW

## 2019-04-01 NOTE — Progress Notes (Signed)
Physical Therapy Treatment Patient Details Name: Danny Glover MRN: [id number] DOB: 14-Aug-1950 Today's Date: 04/01/2019    History of Present Illness Pt is 69 y/o M with PMH that includes: sCHF with ICD, CAD s/p CABG, Afib, trach (chronic, since 2006), T2DM, and HTN. Pt presented after fall at home with dizziness and hit his head. Stroke workup consisted of CT (unable to perform MRI d/t ICD) which revealed small, age-indeterminent infarct of R basal ganglia.    PT Comments    Pt presented with 9/10 B lower leg pain with nursing notified and pain meds requested.   Pt actively participated throughout the session but was limited by fatigue with activity.  Pt was only able to amb a max of 10' with slow, effortful steps and required frequent short therapeutic rest breaks with therex.  Pt's SpO2 remained in the low 90s with HR WNL during the session.  Pt will benefit from PT services in a SNF setting upon discharge to safely address deficits listed in patient problem list for decreased caregiver assistance and eventual return to PLOF.     Follow Up Recommendations  SNF     Equipment Recommendations  None recommended by PT    Recommendations for Other Services       Precautions / Restrictions Precautions Precautions: Fall Restrictions Weight Bearing Restrictions: No    Mobility  Bed Mobility Overal bed mobility: Modified Independent             General bed mobility comments: Extra time and effort required for bed mobility tasks but no physical assistance needed  Transfers Overall transfer level: Needs assistance Equipment used: Rolling walker (2 wheeled) Transfers: Sit to/from Stand Sit to Stand: Min guard         General transfer comment: Min verbal cues for hand placement with good control and stability and no adverse symptoms in standing  Ambulation/Gait Ambulation/Gait assistance: Min guard Gait Distance (Feet): 10 Feet Assistive device: Rolling walker (2  wheeled) Gait Pattern/deviations: Step-through pattern;Decreased step length - right;Decreased step length - left;Trunk flexed Gait velocity: decreased   General Gait Details: Slow, effortful cadence with mod lean on the RW for support and poor activity tolerance with max amb distance of 10'   Stairs             Wheelchair Mobility    Modified Rankin (Stroke Patients Only)       Balance Overall balance assessment: Needs assistance Sitting-balance support: Feet supported Sitting balance-Leahy Scale: Good     Standing balance support: Bilateral upper extremity supported Standing balance-Leahy Scale: Fair Standing balance comment: Mod lean on the RW for support but steady without LOB                            Cognition Arousal/Alertness: Awake/alert Behavior During Therapy: WFL for tasks assessed/performed Overall Cognitive Status: Within Functional Limits for tasks assessed                                        Exercises Total Joint Exercises Ankle Circles/Pumps: AROM;Strengthening;Both;10 reps;5 reps Quad Sets: AROM;Strengthening;Both;10 reps;5 reps Short Arc Quad: Strengthening;Both;10 reps Heel Slides: AAROM;Both;10 reps Hip ABduction/ADduction: AAROM;Both;10 reps;15 reps Straight Leg Raises: AAROM;Both;10 reps;15 reps Long Arc Quad: AROM;Strengthening;Both;10 reps;15 reps Knee Flexion: AROM;Strengthening;Both;10 reps;15 reps Marching in Standing: AROM;Strengthening;Both;10 reps;Standing    General Comments  Pertinent Vitals/Pain Pain Assessment: 0-10 Pain Score: 9  Pain Location: B lower legs Pain Descriptors / Indicators: Sore;Aching Pain Intervention(s): Premedicated before session;Monitored during session;Patient requesting pain meds-RN notified    Home Living                      Prior Function            PT Goals (current goals can now be found in the care plan section) Progress towards PT goals:  Not progressing toward goals - comment(Pt limited by weakness/deficits in activity tolerance)    Frequency    Min 2X/week      PT Plan Current plan remains appropriate    Co-evaluation              AM-PAC PT "6 Clicks" Mobility   Outcome Measure  Help needed turning from your back to your side while in a flat bed without using bedrails?: A Little Help needed moving from lying on your back to sitting on the side of a flat bed without using bedrails?: A Little Help needed moving to and from a bed to a chair (including a wheelchair)?: A Little Help needed standing up from a chair using your arms (e.g., wheelchair or bedside chair)?: A Little Help needed to walk in hospital room?: A Lot Help needed climbing 3-5 steps with a railing? : A Lot 6 Click Score: 16    End of Session Equipment Utilized During Treatment: Gait belt Activity Tolerance: Patient limited by fatigue Patient left: in bed;with call bell/phone within reach;with bed alarm set;Other (comment)(Pt declined up in chair) Nurse Communication: Mobility status PT Visit Diagnosis: Muscle weakness (generalized) (M62.81);Difficulty in walking, not elsewhere classified (R26.2);History of falling (Z91.81)     Time: 4166-0630 PT Time Calculation (min) (ACUTE ONLY): 23 min  Charges:  $Therapeutic Exercise: 23-37 mins                     D. Scott Jaiquan Temme PT, DPT 04/01/19, 3:47 PM

## 2019-04-01 NOTE — TOC Initial Note (Addendum)
Transition of Care [facility]) - Initial/Assessment Note    Patient Details  Name: Danny Glover MRN: [id number] Date of Birth: 08-07-1950  [facility] CM/SW Contact:    Candie Chroman, LCSW Phone Number: 04/01/2019, 3:04 PM  Clinical Narrative:  CSW met with patient. No supports at bedside. CSW introduced role and explained that PT recommendations would be discussed. Patient agreeable to SNF placement. First preference is [facility] because he has been there before. Left admissions coordinator a voicemail. He gave CSW permission to update his daughter. Daughter agreeable to SNF and stated the [facility] after [facility]. CSW left voicemail for [facility] social worker Shauna Hugh Beatty: [phone]). No further concerns. CSW encouraged patient and his daughter to contact CSW as needed. CSW will continue to follow patient and his daughter for support and facilitate discharge to SNF once medically stable.    3:15 pm: Received call back from [place]. They are not assisting with LTC placement because patient is not 70% or more service connected. Patient will have to get LTC placement the standard way by applying for Medicaid and finding a Medicaid bed.  Expected Discharge Plan: Skilled Nursing Facility Barriers to Discharge: Continued Medical Work up   Patient Goals and CMS Choice        Expected Discharge Plan and Services Expected Discharge Plan: [place] Acute Care Choice: [place] Living arrangements for the past 2 months: Apartment                                      Prior Living Arrangements/Services Living arrangements for the past 2 months: Apartment Lives with:: Self Patient language and need for interpreter reviewed:: Yes Do you feel safe going back to the place where you live?: Yes      Need for Family Participation in Patient Care: Yes (Comment) Care giver support system in place?: Yes  (comment)   Criminal Activity/Legal Involvement Pertinent to Current Situation/Hospitalization: No - Comment as needed  Activities of Daily Living Home Assistive Devices/Equipment: Other (Comment)(RW) ADL Screening (condition at time of admission) Patient's cognitive ability adequate to safely complete daily activities?: Yes Is the patient deaf or have difficulty hearing?: No Does the patient have difficulty seeing, even when wearing glasses/contacts?: No Does the patient have difficulty concentrating, remembering, or making decisions?: No Patient able to express need for assistance with ADLs?: Yes Does the patient have difficulty dressing or bathing?: Yes Independently performs ADLs?: Yes (appropriate for developmental age) Does the patient have difficulty walking or climbing stairs?: Yes Weakness of Legs: Both Weakness of Arms/Hands: Both  Permission Sought/Granted Permission sought to share information with : Facility Sport and exercise psychologist, Family Supports Permission granted to share information with : Yes, Verbal Permission Granted  Share Information with NAME: Cleda Clarks  Permission granted to share info w AGENCY: SNF's  Permission granted to share info w Relationship: Daughter  Permission granted to share info w Contact Information: [phone]  Emotional Assessment Appearance:: Appears stated age Attitude/Demeanor/Rapport: Engaged, Gracious Affect (typically observed): Accepting, Appropriate, Calm, Pleasant Orientation: : Oriented to Self, Oriented to Place, Oriented to  Time, Oriented to Situation Alcohol / Substance Use: Not Applicable Psych Involvement: No (comment)  Admission diagnosis:  Falls frequently [R29.6] Acute ischemic stroke ([place]) [I63.9] Multiple falls [R29.6] Laceration of scalp, initial encounter [S01.01XA] Patient Active Problem List   Diagnosis Date  Noted  . Falls frequently 03/27/2019  . Paroxysmal atrial fibrillation ([place]) 01/03/2015  .  Chronic obstructive pulmonary disease ([place]) 01/01/2015  . Chronic systolic (congestive) heart failure ([place]) 01/01/2015  . Controlled type 2 diabetes mellitus without complication ([place]) 70/14/1030  . Coronary artery disease involving native coronary artery 01/01/2015  . Essential hypertension 01/01/2015  . History of vocal cord paralysis 01/01/2015  . Hyperlipidemia 01/01/2015  . Tobacco abuse 01/01/2015  . Tracheostomy dependent ([place]) 01/01/2015  . Biventricular ICD (implantable cardioverter-defibrillator) in place 09/01/2014   PCP:  System, Pcp Not In Pharmacy:   [facility], [address] [place] [address] Phone: [phone] Fax: [phone]     Social Determinants of Health (SDOH) Interventions    Readmission Risk Interventions No flowsheet data found.

## 2019-04-01 NOTE — Plan of Care (Signed)
  Problem: Education: Goal: Knowledge of General Education information will improve Description: Including pain rating scale, medication(s)/side effects and non-pharmacologic comfort measures Outcome: Progressing   Problem: Health Behavior/Discharge Planning: Goal: Ability to manage health-related needs will improve Outcome: Progressing   Problem: Clinical Measurements: Goal: Ability to maintain clinical measurements within normal limits will improve Outcome: Progressing Goal: Will remain free from infection Outcome: Progressing Goal: Diagnostic test results will improve Outcome: Progressing Goal: Respiratory complications will improve Outcome: Progressing Goal: Cardiovascular complication will be avoided Outcome: Progressing   Problem: Activity: Goal: Risk for activity intolerance will decrease Outcome: Progressing   Problem: Nutrition: Goal: Adequate nutrition will be maintained Outcome: Progressing   Problem: Coping: Goal: Level of anxiety will decrease Outcome: Progressing   Problem: Elimination: Goal: Will not experience complications related to bowel motility Outcome: Progressing Goal: Will not experience complications related to urinary retention Outcome: Progressing   Problem: Pain Managment: Goal: General experience of comfort will improve Outcome: Progressing   Problem: Safety: Goal: Ability to remain free from injury will improve Outcome: Progressing   Problem: Skin Integrity: Goal: Risk for impaired skin integrity will decrease Outcome: Progressing   Problem: Education: Goal: Knowledge of General Education information will improve Description: Including pain rating scale, medication(s)/side effects and non-pharmacologic comfort measures Outcome: Progressing   Problem: Clinical Measurements: Goal: Ability to maintain clinical measurements within normal limits will improve Outcome: Progressing Goal: Will remain free from infection Outcome:  Progressing Goal: Diagnostic test results will improve Outcome: Progressing Goal: Respiratory complications will improve Outcome: Progressing Goal: Cardiovascular complication will be avoided Outcome: Progressing   Problem: Activity: Goal: Risk for activity intolerance will decrease Outcome: Progressing   Problem: Coping: Goal: Level of anxiety will decrease Outcome: Progressing   Problem: Elimination: Goal: Will not experience complications related to bowel motility Outcome: Progressing Goal: Will not experience complications related to urinary retention Outcome: Progressing   Problem: Pain Managment: Goal: General experience of comfort will improve Outcome: Progressing   Problem: Skin Integrity: Goal: Risk for impaired skin integrity will decrease Outcome: Progressing   Problem: Education: Goal: Knowledge about tracheostomy care/management will improve Outcome: Progressing   Problem: Activity: Goal: Ability to tolerate increased activity will improve Outcome: Progressing   Problem: Health Behavior/Discharge Planning: Goal: Ability to manage tracheostomy will improve Outcome: Progressing   Problem: Education: Goal: Knowledge of disease or condition will improve Outcome: Progressing Goal: Knowledge of secondary prevention will improve Outcome: Progressing Goal: Knowledge of patient specific risk factors addressed and post discharge goals established will improve Outcome: Progressing

## 2019-04-01 NOTE — Consult Note (Signed)
WOC Nurse Consult Note: Reason for Consult:Patient with chronic skin changes to bilateral lower legs. Has HH that wraps legs twice weekly.  Nonintact lesions to left anterior and right posterior lower legs.  Secured with kerlix and COban.  Will continue this here. Legs moisturized with eucerin as well Wound type:chronic venous Pressure Injury POA: NA Measurement:Left anterior:  4 cm x 2.4 cm x 0.2 cm  Right posterior 4 cm 4 cmx 0.2 cm  Wound bed:red and moist Drainage (amount, consistency, odor) minimal serosanguinous dressings adhere to wound bed.  Periwound:chronic skin changes  Dressing procedure/placement/frequency:Monday and THursday. Cleanse legs with soap and water.  Aquacel Ag to left anterior and right posterior leg wounds.  Wrap leg with kerlix form below toe to below knee secure with coban.  Bedside RN to perform.   Will not follow at this time.  Please re-consult if needed.  Maple Hudson MSN, RN, FNP-BC CWON Wound, Ostomy, Continence Nurse Pager [phone]

## 2019-04-01 NOTE — Progress Notes (Signed)
OT Cancellation Note  Patient Details Name: Danny Glover MRN: [id number] DOB: [birth year]   Cancelled Treatment:    Reason Eval/Treat Not Completed: Fatigue/lethargy limiting ability to participate  Pt reports being fatigued as he had just finished up with physical therapy approximately 10 mins prior to OT presenting for treatment. Pt requests to rest at this time. Will follow up for occupational therapy treatment as pt can tolerate. Thank you.  Rejeana Brock, MS, OTR/L ascom [phone] 04/01/19, 3:37 PM

## 2019-04-01 NOTE — Progress Notes (Signed)
PROGRESS NOTE    Danny Glover  LZJ:[id number] DOB: Feb 01, 1951 DOA: 03/27/2019 PCP: System, Pcp Not In    Assessment & Plan:   Active Problems:   Falls frequently    Danny Glover is a 69 y.o. Caucasian male with medical history significant of sCHF with ICD, CAD s/p CABG, Afib on Eliquis, tracheostomy, DM2, HTN who presented after falling and hitting his head.   # Frequent falls --frequent falls in recent days, with dizziness and imbalance prior.  Had sustained scalp laceration.  No report of associated syncope.  PCP had noted decline. PLAN: --PT/OT rec SNF   # Possible small age-indeterminate infarct of the inferior right basal ganglia --As seen on head CT. --A1c 7.3, LDL 43, TSH 4.8  --Neurology consulted.  No MRI brain since pt has an ICD PLAN: --Lipitor 80 mg daily --PT/OT (pt denied swallowing problems) --smoking cessation  # Chronic venous stasis ulcers in BLE --Does not appear to be cellulitis --Wound care consulted, ACE wrap applied.  # CKD # AKI, improved --Cr 2.65 on presentation, 1.54 in May 2020.  AKI likely pre-renal from poor PO intake and hydration. --s/p NS 500 ml in the ED and LR@100  for 10 hours per day for 2 days PLAN: --Hold further IVF  --Encourage PO hydration  # Hyperkalemia, resolved --Pt refused Kayexalate --order Lokelma instead, now d/c'ed.  # Microcytic anemia 2/2 iron def --iron profile showed severe iron def.  Retic elevated at 32%, LDH wnl.  Pt denied signs of GI bleed.  Does not tolerate oral iron supplement, per PCP.  Hx of gastric ulcer, per PCP, who will arrange outpatient workup. --1u pRBC  PLAN: --transfuse if Hgb <7 --IV iron 200 mg daily x3 doses --Outpatient GI workup, per PCP --Hold home Eliquis for now  # Chronic systolic CHF LVEF 35% with BiV-ICD # Hx of CAD s/p CABG # HTN --continue home metop and Lisinopril --Hold home torsemide due to AKI  # Afib on Eliquis --continue home metop --Hold Eliquis for now since  Hgb is trending down  # s/p tracheostomy in 2006, stable  # DM2 --no diabetic meds on med list --A1c 7.3 --No need fo fingersticks or insulin   # HLD --continue home statin  # Hx of gout --continue home allopurinol  # GERD --continue home PI   DVT prophylaxis: Lovenox SQ Code Status: Full code  Family Communication: not today Disposition Plan: Pt is medically ready for discharge.  PT rec SNF, because pt lives alone and is not safe to go home.  Pt's PCP (Danny Glover) at [facility] also wants pt to be placed in a [place] SNF rehab, and is working on getting pt a bed.  I have updated case manager last week via secure message about the plan to discharge to [facility], and PCP from [place] will coordinate with our case manager for pt's disposition.  Pt's PCP can not give me a firm date, but said should be early this week.   Subjective and Interval History:  No fever, dyspnea, chest pain, abdominal pain, N/V/D, dysuria.  Starting to eat and drink better.  Legs re-wrapped.    Objective: Vitals:   03/31/19 1740 03/31/19 2336 04/01/19 0725 04/01/19 1529  BP: 130/65 (!) 119/58 (!) 124/53 131/64  Pulse: 69 70  69  Resp: 20 18 16    Temp: 97.6 F (36.4 C) (!) 97.2 F (36.2 C) 98.2 F (36.8 C) (!) 97.3 F (36.3 C)  TempSrc: Oral Oral Oral Oral  SpO2: 100% 97%  99% 100%  Weight:      Height:        Intake/Output Summary (Last 24 hours) at 04/01/2019 2050 Last data filed at 04/01/2019 1759 Gross per 24 hour  Intake 1050.73 ml  Output --  Net 1050.73 ml   Filed Weights   03/27/19 1025  Weight: 90.7 kg    Examination:   Constitutional: NAD, alert, oriented HEENT: conjunctivae and lids normal, EOMI.  CV: RRR no M,R,G. Distal pulses +2.  No cyanosis.   RESP: coarse and gurgling breathing sounds, normal respiratory effort, with trach collar, on RA GI: +BS, NTND Extremities: ACE wrap of both legs SKIN: warm.  Extensive bruising on UE's.   Neuro: II - XII grossly intact.  Sensation  intact Psych: Normal mood and affect.     Data Reviewed: I have personally reviewed following labs and imaging studies  CBC: Recent Labs  Lab 03/27/19 1154 03/27/19 1154 03/28/19 0550 03/29/19 0439 03/30/19 0551 03/31/19 0915 04/01/19 0510  WBC 10.7*   < > 7.7 8.1 7.9 9.0 9.8  NEUTROABS 8.7*  --   --   --   --   --   --   HGB 8.2*   < > 7.3* 7.0* 8.1* 9.0* 8.6*  HCT 30.5*   < > 27.4* 26.3* 28.8* 32.6* 31.9*  MCV 75.9*   < > 75.5* 76.5* 77.0* 78.9* 79.4*  PLT 160   < > 156 157 165 180 169   < > = values in this interval not displayed.   Basic Metabolic Panel: Recent Labs  Lab 03/28/19 0550 03/29/19 0439 03/30/19 0551 03/31/19 0915 04/01/19 0510  NA 140 138 139 138 140  K 5.6* 5.2* 5.3* 4.9 4.8  CL 105 105 106 104 106  CO2 27 26 24 26 26   GLUCOSE 110* 123* 137* 115* 138*  BUN 114* 99* 89* 76* 62*  CREATININE 1.96* 1.74* 1.83* 1.74* 1.59*  CALCIUM 9.2 8.8* 8.8* 9.0 8.7*  MG 2.0 1.9 1.8 1.8 1.8   GFR: Estimated Creatinine Clearance: 48.1 mL/min (A) (by C-G formula based on SCr of 1.59 mg/dL (H)). Liver Function Tests: Recent Labs  Lab 03/27/19 1401  AST 35  ALT 16  ALKPHOS 77  BILITOT 1.6*  PROT 8.1  ALBUMIN 3.1*   No results for input(s): LIPASE, AMYLASE in the last 168 hours. No results for input(s): AMMONIA in the last 168 hours. Coagulation Profile: No results for input(s): INR, PROTIME in the last 168 hours. Cardiac Enzymes: No results for input(s): CKTOTAL, CKMB, CKMBINDEX, TROPONINI in the last 168 hours. BNP (last 3 results) No results for input(s): PROBNP in the last 8760 hours. HbA1C: No results for input(s): HGBA1C in the last 72 hours. CBG: No results for input(s): GLUCAP in the last 168 hours. Lipid Profile: No results for input(s): CHOL, HDL, LDLCALC, TRIG, CHOLHDL, LDLDIRECT in the last 72 hours. Thyroid Function Tests: No results for input(s): TSH, T4TOTAL, FREET4, T3FREE, THYROIDAB in the last 72 hours. Anemia Panel: No results for  input(s): VITAMINB12, FOLATE, FERRITIN, TIBC, IRON, RETICCTPCT in the last 72 hours. Sepsis Labs: No results for input(s): PROCALCITON, LATICACIDVEN in the last 168 hours.  Recent Results (from the past 240 hour(s))  SARS CORONAVIRUS 2 (TAT 6-24 HRS) Nasopharyngeal Nasopharyngeal Swab     Status: None   Collection Time: 03/27/19 11:54 AM   Specimen: Nasopharyngeal Swab  Result Value Ref Range Status   SARS Coronavirus 2 NEGATIVE NEGATIVE Final    Comment: (NOTE) SARS-CoV-2 target nucleic acids  are NOT DETECTED. The SARS-CoV-2 RNA is generally detectable in upper and lower respiratory specimens during the acute phase of infection. Negative results do not preclude SARS-CoV-2 infection, do not rule out co-infections with other pathogens, and should not be used as the sole basis for treatment or other patient management decisions. Negative results must be combined with clinical observations, patient history, and epidemiological information. The expected result is Negative. Fact Sheet for Patients: SugarRoll.be Fact Sheet for Healthcare Providers: https://www.woods-mathews.com/ This test is not yet approved or cleared by the [place] FDA and  has been authorized for detection and/or diagnosis of SARS-CoV-2 by FDA under an Emergency Use Authorization (EUA). This EUA will remain  in effect (meaning this test can be used) for the duration of the COVID-19 declaration under Section 56 4(b)(1) of the Act, 21 U.S.C. section 360bbb-3(b)(1), unless the authorization is terminated or revoked sooner. Performed at [facility], [place] [address], [place], [place] [address]       Radiology Studies: No results found.   Scheduled Meds: . allopurinol  100 mg Oral Daily  . atorvastatin  80 mg Oral Nightly  . enoxaparin (LOVENOX) injection  40 mg Subcutaneous Q24H  . ferrous sulfate  324 mg Oral Q breakfast  . hydrocerin   Topical Once per day on  Mon Thu  . lisinopril  2.5 mg Oral Daily  . metoprolol  100 mg Oral Daily  . mirtazapine  7.5 mg Oral QHS  . pantoprazole  40 mg Oral Daily   Continuous Infusions:    LOS: 5 days     Enzo Bi, MD [facility] If 7PM-7AM, please contact night-coverage 04/01/2019, 8:50 PM

## 2019-04-02 LAB — CBC
HCT: 32 % — ABNORMAL LOW (ref 39.0–52.0)
Hemoglobin: 8.6 g/dL — ABNORMAL LOW (ref 13.0–17.0)
MCH: 21.6 pg — ABNORMAL LOW (ref 26.0–34.0)
MCHC: 26.9 g/dL — ABNORMAL LOW (ref 30.0–36.0)
MCV: 80.2 fL (ref 80.0–100.0)
Platelets: 174 10*3/uL (ref 150–400)
RBC: 3.99 MIL/uL — ABNORMAL LOW (ref 4.22–5.81)
RDW: 24.3 % — ABNORMAL HIGH (ref 11.5–15.5)
WBC: 10 10*3/uL (ref 4.0–10.5)
nRBC: 0.5 % — ABNORMAL HIGH (ref 0.0–0.2)

## 2019-04-02 LAB — BASIC METABOLIC PANEL
Anion gap: 9 (ref 5–15)
BUN: 53 mg/dL — ABNORMAL HIGH (ref 8–23)
CO2: 29 mmol/L (ref 22–32)
Calcium: 8.9 mg/dL (ref 8.9–10.3)
Chloride: 104 mmol/L (ref 98–111)
Creatinine, Ser: 1.47 mg/dL — ABNORMAL HIGH (ref 0.61–1.24)
GFR calc Af Amer: 55 mL/min — ABNORMAL LOW (ref >60)
GFR calc non Af Amer: 48 mL/min — ABNORMAL LOW (ref >60)
Glucose, Bld: 100 mg/dL — ABNORMAL HIGH (ref 70–99)
Potassium: 4.5 mmol/L (ref 3.5–5.1)
Sodium: 142 mmol/L (ref 135–145)

## 2019-04-02 LAB — MAGNESIUM: Magnesium: 1.7 mg/dL (ref 1.7–2.4)

## 2019-04-02 LAB — SARS CORONAVIRUS 2 (TAT 6-24 HRS): SARS Coronavirus 2: NEGATIVE

## 2019-04-02 NOTE — TOC Progression Note (Signed)
Transition of Care [facility]) - Progression Note    Patient Details  Name: Danny Glover MRN: [id number] Date of Birth: 19-Feb-1951  Transition of Care [facility]) CM/SW Contact  Margarito Liner, LCSW Phone Number: 04/02/2019, 3:39 PM  Clinical Narrative: [facility] is able to offer a bed for rehab. Admissions coordinator called daughter for Medicaid screen and thinks he would qualify. She discussed with daughter that it will be patient's decision whether he wants LTC or not. CSW notified patient that [facility] can take him for rehab and he is agreeable. MD ordered new COVID test. CSW faxed clinicals to [facility] for review.     Expected Discharge Plan: Skilled Nursing Facility Barriers to Discharge: Continued Medical Work up  Expected Discharge Plan and Services Expected Discharge Plan: Skilled Nursing Facility     Post Acute Care Choice: Skilled Nursing Facility Living arrangements for the past 2 months: Apartment                                       Social Determinants of Health (SDOH) Interventions    Readmission Risk Interventions No flowsheet data found.

## 2019-04-02 NOTE — Progress Notes (Signed)
PROGRESS NOTE    Danny Glover  XVQ:[id number] DOB: 1950/04/04 DOA: 03/27/2019 PCP: System, Pcp Not In    Assessment & Plan:   Active Problems:   Falls frequently    Danny Glover is a 69 y.o. Caucasian male with medical history significant of sCHF with ICD, CAD s/p CABG, Afib on Eliquis, tracheostomy, DM2, HTN who presented after falling and hitting his head.   # Frequent falls --frequent falls in recent days, with dizziness and imbalance prior.  Had sustained scalp laceration.  No report of associated syncope.  PCP had noted decline. PLAN: --PT/OT rec SNF   # Possible small age-indeterminate infarct of the inferior right basal ganglia --As seen on head CT. --A1c 7.3, LDL 43, TSH 4.8  --Neurology consulted.  No MRI brain since pt has an ICD PLAN: --Lipitor 80 mg daily --PT/OT (pt denied swallowing problems) --smoking cessation  # Chronic venous stasis ulcers in BLE --Does not appear to be cellulitis --Wound care consulted, ACE wrap applied.  # CKD # AKI, resolved --Cr 2.65 on presentation, 1.54 in May 2020.  AKI likely pre-renal from poor PO intake and hydration. --s/p NS 500 ml in the ED and LR@100  for 10 hours per day for 2 days PLAN: --Encourage PO hydration  # Hyperkalemia, resolved --Pt refused Kayexalate --order Lokelma instead, now d/c'ed.  # Microcytic anemia 2/2 iron def --iron profile showed severe iron def.  Retic elevated at 32%, LDH wnl.  Pt denied signs of GI bleed.  Does not tolerate oral iron supplement, per PCP.  Hx of gastric ulcer, per PCP, who will arrange outpatient workup. --1u pRBC, and IV iron 200 mg daily x3 doses PLAN: --transfuse if Hgb <7 --Outpatient GI workup, per PCP --Hold home Eliquis for now  # Chronic systolic CHF LVEF 09% with BiV-ICD # Hx of CAD s/p CABG # HTN --continue home metop and Lisinopril --Hold home torsemide due to AKI  # Afib on Eliquis --continue home metop --Hold Eliquis for now since Hgb is trending  down  # s/p tracheostomy in 2006, stable  # DM2 --no diabetic meds on med list --A1c 7.3 --No need fo fingersticks or insulin   # HLD --continue home statin  # Hx of gout --continue home allopurinol  # GERD --continue home PI   DVT prophylaxis: Lovenox SQ Code Status: Full code  Family Communication: not today Disposition Plan: Pt is medically ready for discharge when there is a SNF bed.  PT rec SNF, because pt lives alone and is not safe to go home.  Pt's PCP (Dan Burlinson) at [place] had originally contacted me and asked me to hold pt until he finds a bed in [place] SNF (which was supposedly sometime early this week), however, today I learned from our SW that pt will not go over to [place] SNF but just regular SNF rehab.  SW has already located a facility and likely pt can go tomorrow.   Subjective and Interval History:  Pt was sleeping today during rounds, and when he woke up, he had more secretion with his trach, which pt said was normal for him.  Still doing fine.  No fever, dyspnea, chest pain, abdominal pain, N/V/D, dysuria.   Objective: Vitals:   04/01/19 0725 04/01/19 1529 04/02/19 0014 04/02/19 0808  BP: (!) 124/53 131/64 112/66 137/64  Pulse:  69 70 67  Resp: 16  14 16   Temp: 98.2 F (36.8 C) (!) 97.3 F (36.3 C) 98.2 F (36.8 C)   TempSrc: Oral  Oral Oral   SpO2: 99% 100% 96% 97%  Weight:      Height:        Intake/Output Summary (Last 24 hours) at 04/02/2019 1422 Last data filed at 04/02/2019 1300 Gross per 24 hour  Intake 930.73 ml  Output -  Net 930.73 ml   Filed Weights   03/27/19 1025  Weight: 90.7 kg    Examination:   Constitutional: NAD, alert, oriented HEENT: conjunctivae and lids normal, EOMI.  CV: RRR no M,R,G. Distal pulses +2.  No cyanosis.   RESP: coarse and gurgling breathing sounds, normal respiratory effort, with trach collar, on RA GI: +BS, NTND Extremities: ACE wrap of both legs SKIN: warm.  Extensive bruising on UE's.   Neuro: II -  XII grossly intact.  Sensation intact Psych: Normal mood and affect.     Data Reviewed: I have personally reviewed following labs and imaging studies  CBC: Recent Labs  Lab 03/27/19 1154 03/28/19 0550 03/29/19 0439 03/30/19 0551 03/31/19 0915 04/01/19 0510 04/02/19 0459  WBC 10.7*   < > 8.1 7.9 9.0 9.8 10.0  NEUTROABS 8.7*  --   --   --   --   --   --   HGB 8.2*   < > 7.0* 8.1* 9.0* 8.6* 8.6*  HCT 30.5*   < > 26.3* 28.8* 32.6* 31.9* 32.0*  MCV 75.9*   < > 76.5* 77.0* 78.9* 79.4* 80.2  PLT 160   < > 157 165 180 169 174   < > = values in this interval not displayed.   Basic Metabolic Panel: Recent Labs  Lab 03/29/19 0439 03/30/19 0551 03/31/19 0915 04/01/19 0510 04/02/19 0459  NA 138 139 138 140 142  K 5.2* 5.3* 4.9 4.8 4.5  CL 105 106 104 106 104  CO2 26 24 26 26 29   GLUCOSE 123* 137* 115* 138* 100*  BUN 99* 89* 76* 62* 53*  CREATININE 1.74* 1.83* 1.74* 1.59* 1.47*  CALCIUM 8.8* 8.8* 9.0 8.7* 8.9  MG 1.9 1.8 1.8 1.8 1.7   GFR: Estimated Creatinine Clearance: 52.1 mL/min (A) (by C-G formula based on SCr of 1.47 mg/dL (H)). Liver Function Tests: Recent Labs  Lab 03/27/19 1401  AST 35  ALT 16  ALKPHOS 77  BILITOT 1.6*  PROT 8.1  ALBUMIN 3.1*   No results for input(s): LIPASE, AMYLASE in the last 168 hours. No results for input(s): AMMONIA in the last 168 hours. Coagulation Profile: No results for input(s): INR, PROTIME in the last 168 hours. Cardiac Enzymes: No results for input(s): CKTOTAL, CKMB, CKMBINDEX, TROPONINI in the last 168 hours. BNP (last 3 results) No results for input(s): PROBNP in the last 8760 hours. HbA1C: No results for input(s): HGBA1C in the last 72 hours. CBG: No results for input(s): GLUCAP in the last 168 hours. Lipid Profile: No results for input(s): CHOL, HDL, LDLCALC, TRIG, CHOLHDL, LDLDIRECT in the last 72 hours. Thyroid Function Tests: No results for input(s): TSH, T4TOTAL, FREET4, T3FREE, THYROIDAB in the last 72  hours. Anemia Panel: No results for input(s): VITAMINB12, FOLATE, FERRITIN, TIBC, IRON, RETICCTPCT in the last 72 hours. Sepsis Labs: No results for input(s): PROCALCITON, LATICACIDVEN in the last 168 hours.  Recent Results (from the past 240 hour(s))  SARS CORONAVIRUS 2 (TAT 6-24 HRS) Nasopharyngeal Nasopharyngeal Swab     Status: None   Collection Time: 03/27/19 11:54 AM   Specimen: Nasopharyngeal Swab  Result Value Ref Range Status   SARS Coronavirus 2 NEGATIVE NEGATIVE Final  Comment: (NOTE) SARS-CoV-2 target nucleic acids are NOT DETECTED. The SARS-CoV-2 RNA is generally detectable in upper and lower respiratory specimens during the acute phase of infection. Negative results do not preclude SARS-CoV-2 infection, do not rule out co-infections with other pathogens, and should not be used as the sole basis for treatment or other patient management decisions. Negative results must be combined with clinical observations, patient history, and epidemiological information. The expected result is Negative. Fact Sheet for Patients: HairSlick.no Fact Sheet for Healthcare Providers: quierodirigir.com This test is not yet approved or cleared by the [place] FDA and  has been authorized for detection and/or diagnosis of SARS-CoV-2 by FDA under an Emergency Use Authorization (EUA). This EUA will remain  in effect (meaning this test can be used) for the duration of the COVID-19 declaration under Section 56 4(b)(1) of the Act, 21 U.S.C. section 360bbb-3(b)(1), unless the authorization is terminated or revoked sooner. Performed at [facility], [address], [place], [place] [address]       Radiology Studies: No results found.   Scheduled Meds: . allopurinol  100 mg Oral Daily  . atorvastatin  80 mg Oral Nightly  . enoxaparin (LOVENOX) injection  40 mg Subcutaneous Q24H  . ferrous sulfate  324 mg Oral Q breakfast   . gabapentin  100 mg Oral Q12H  . hydrocerin   Topical Once per day on Mon Thu  . lisinopril  2.5 mg Oral Daily  . metoprolol  100 mg Oral Daily  . mirtazapine  7.5 mg Oral QHS  . pantoprazole  40 mg Oral Daily   Continuous Infusions:    LOS: 6 days     Darlin Priestly, MD [facility] If 7PM-7AM, please contact night-coverage 04/02/2019, 2:22 PM

## 2019-04-02 NOTE — Progress Notes (Signed)
Occupational Therapy Treatment Patient Details Name: Danny Glover MRN: [id number] DOB: 09/23/1950 Today's Date: 04/02/2019    History of present illness Pt is 69 y/o M with PMH that includes: sCHF with ICD, CAD s/p CABG, Afib, trach (chronic, since 2006), T2DM, and HTN. Pt presented after fall at home with dizziness and hit his head. Stroke workup consisted of CT (unable to perform MRI d/t ICD) which revealed small, age-indeterminent infarct of R basal ganglia.   OT comments  Pt seen for OT tx this date to f/u re: safety with ADLs/ADL mobility. Pt requires extended time to motivate to participate in therapy this date citing fatigue/pain and ultimately declines to participate in ADL/self care at this time. Pt is, however, agreeable to some postural/breathing techniques/exercise education. All activity requires extended time during session 2/2 pt fatigue/low tolerance. OT has pt sit EOB (performs sup<>sit with MOD I) to participate in deep breathing and postural exercise. Tolerates EOB sitting x20 mins. MIN verbal/visual cues for form/pace initially and then pt is able to complete remainder of exercise independently. Overall, pt presents with very low fxl activity tolerance and SNF remains most appropriate d/c disposition.    Follow Up Recommendations    SNF   Equipment Recommendations    defer to next level of care   Recommendations for Other Services      Precautions / Restrictions Precautions Precautions: Fall Restrictions Weight Bearing Restrictions: No       Mobility Bed Mobility Overal bed mobility: Modified Independent Bed Mobility: Rolling;Supine to Sit;Sit to Supine Rolling: Modified independent (Device/Increase time)   Supine to sit: Modified independent (Device/Increase time) Sit to supine: Modified independent (Device/Increase time)   General bed mobility comments: extended time, and some c/o pain when getting back to bed, but completes without OT assist  Transfers                 General transfer comment: pt declines to participate in transfer with OT this date    Balance Overall balance assessment: Needs assistance Sitting-balance support: Feet supported Sitting balance-Leahy Scale: Good                                     ADL either performed or assessed with clinical judgement   ADL                                         General ADL Comments: Pt difficult to motivate to participate in ADLs this date despite multiple efforts including providing education re: importance of oral care despite not having teeth including for prevention of heart disease.     Vision Patient Visual Report: No change from baseline     Perception     Praxis      Cognition Arousal/Alertness: Awake/alert Behavior During Therapy: WFL for tasks assessed/performed Overall Cognitive Status: Within Functional Limits for tasks assessed                                          Exercises Other Exercises Other Exercises: OT encourages pt participation in deep breathing/coughing while seated EOB to clear wet secretions that were audible. Pt has success of clearing secretions one time during this process and OT facilitates education re:  importance of cough/deep breathe for prevention of opportunistic infection though clearing secretions. Pt verbalized understanding. Other Exercises: OT facilitates pt particiation in postural exercises for 1 set x10 reps with 5 second hold on each rep for trunk extension with scapular retraction to improve surface area for breathing. Pt requires MIN cues for form initially and then is able to complete without further instruction.   Shoulder Instructions       General Comments      Pertinent Vitals/ Pain       Pain Assessment: 0-10 Pain Score: 6  Pain Location: B lower legs, 6 laying in bed Pain Descriptors / Indicators: Sore;Aching Pain Intervention(s): Monitored during  session;Repositioned  Home Living                                          Prior Functioning/Environment              Frequency  Min 1X/week        Progress Toward Goals  OT Goals(current goals can now be found in the care plan section)  Progress towards OT goals: Progressing toward goals  Acute Rehab OT Goals Patient Stated Goal: To get stronger and walk better OT Goal Formulation: With patient Time For Goal Achievement: 04/12/19 Potential to Achieve Goals: Good  Plan Discharge plan remains appropriate    Co-evaluation                 AM-PAC OT "6 Clicks" Daily Activity     Outcome Measure   Help from another person eating meals?: None Help from another person taking care of personal grooming?: None Help from another person toileting, which includes using toliet, bedpan, or urinal?: A Lot Help from another person bathing (including washing, rinsing, drying)?: A Lot Help from another person to put on and taking off regular upper body clothing?: None Help from another person to put on and taking off regular lower body clothing?: A Lot 6 Click Score: 18    End of Session    OT Visit Diagnosis: Muscle weakness (generalized) (M62.81);History of falling (Z91.81)   Activity Tolerance Patient limited by fatigue;Patient limited by pain   Patient Left in bed;with call bell/phone within reach;with bed alarm set   Nurse Communication          Time: [id number] OT Time Calculation (min): 31 min  Charges: OT General Charges $OT Visit: 1 Visit OT Treatments $Therapeutic Activity: 8-22 mins $Therapeutic Exercise: 8-22 mins  Rejeana Brock, MS, OTR/L ascom [phone] 04/02/19, 11:07 AM

## 2019-04-02 NOTE — Progress Notes (Signed)
Patients trach was out upon entry. Patients 4.0 uncuffed trach re-inserted with obturator and lube. RN at bedside. Patient had no desaturation, bleeding or adverse reaction upon trach insertion. Patient on Room air as before.

## 2019-04-02 NOTE — Progress Notes (Signed)
Physical Therapy Treatment Patient Details Name: Danny Glover MRN: [id number] DOB: Oct 15, 1950 Today's Date: 04/02/2019    History of Present Illness Pt is 69 y/o M with PMH that includes: sCHF with ICD, CAD s/p CABG, Afib, trach (chronic, since 2006), T2DM, and HTN. Pt presented after fall at home with dizziness and hit his head. Stroke workup consisted of CT (unable to perform MRI d/t ICD) which revealed small, age-indeterminent infarct of R basal ganglia.    PT Comments    Pt actively participated during the session but fatigued quickly requiring frequent therapeutic rest breaks.  Pt slow with bed mobility tasks requiring extra effort and time but no physical assistance.  Pt was able to amb 15' this session but reported that was his max and was too fatigued to attempt a second walk even after sitting and resting.  Pt's SpO2 remained in the low 90s and HR was WNL during the session on room air.  Pt will benefit from PT services in a SNF setting upon discharge to safely address deficits listed in patient problem list for decreased caregiver assistance and eventual return to PLOF.   Follow Up Recommendations  SNF     Equipment Recommendations  None recommended by PT    Recommendations for Other Services       Precautions / Restrictions Precautions Precautions: Fall Restrictions Weight Bearing Restrictions: No    Mobility  Bed Mobility Overal bed mobility: Modified Independent Bed Mobility: Rolling;Supine to Sit;Sit to Supine           General bed mobility comments: Extra time and effort required  Transfers Overall transfer level: Needs assistance Equipment used: Rolling walker (2 wheeled) Transfers: Sit to/from Stand Sit to Stand: Min guard         General transfer comment: Fair concentric control with some difficulty with eccentric lowering to sitting with control  Ambulation/Gait Ambulation/Gait assistance: Min guard Gait Distance (Feet): 15 Feet Assistive  device: Rolling walker (2 wheeled) Gait Pattern/deviations: Step-through pattern;Decreased step length - right;Decreased step length - left;Trunk flexed Gait velocity: decreased   General Gait Details: Slow, effortful cadence with mod lean on the RW for support and poor activity tolerance with max amb distance of 15'   Stairs             Wheelchair Mobility    Modified Rankin (Stroke Patients Only)       Balance Overall balance assessment: Needs assistance Sitting-balance support: Feet supported Sitting balance-Leahy Scale: Good     Standing balance support: Bilateral upper extremity supported Standing balance-Leahy Scale: Fair Standing balance comment: Mod lean on the RW for support but steady without LOB                            Cognition Arousal/Alertness: Awake/alert Behavior During Therapy: WFL for tasks assessed/performed Overall Cognitive Status: Within Functional Limits for tasks assessed                                        Exercises Total Joint Exercises Ankle Circles/Pumps: AROM;Strengthening;Both;10 reps;5 reps Quad Sets: AROM;Strengthening;Both;10 reps;5 reps Gluteal Sets: Strengthening;Both;10 reps;5 reps Short Arc Quad: Strengthening;Both;10 reps;AAROM Hip ABduction/ADduction: AAROM;Both;10 reps Long Arc Quad: AROM;Strengthening;Both;10 reps;15 reps Knee Flexion: AROM;Strengthening;Both;10 reps;15 reps  Seated hip flex AROM/strengthening x 10 Both    General Comments        Pertinent Vitals/Pain Pain  Assessment: 0-10 Pain Score: 5  Pain Location: B lower legs Pain Descriptors / Indicators: Sore;Aching Pain Intervention(s): Premedicated before session;Monitored during session    Home Living                      Prior Function            PT Goals (current goals can now be found in the care plan section) Progress towards PT goals: Progressing toward goals    Frequency    Min 2X/week       PT Plan Current plan remains appropriate    Co-evaluation              AM-PAC PT "6 Clicks" Mobility   Outcome Measure  Help needed turning from your back to your side while in a flat bed without using bedrails?: A Little Help needed moving from lying on your back to sitting on the side of a flat bed without using bedrails?: A Little Help needed moving to and from a bed to a chair (including a wheelchair)?: A Little Help needed standing up from a chair using your arms (e.g., wheelchair or bedside chair)?: A Little Help needed to walk in hospital room?: A Little Help needed climbing 3-5 steps with a railing? : A Lot 6 Click Score: 17    End of Session Equipment Utilized During Treatment: Gait belt Activity Tolerance: Patient limited by fatigue Patient left: with call bell/phone within reach;in chair;with chair alarm set Nurse Communication: Mobility status PT Visit Diagnosis: Muscle weakness (generalized) (M62.81);Difficulty in walking, not elsewhere classified (R26.2);History of falling (Z91.81)     Time: 9417-4081 PT Time Calculation (min) (ACUTE ONLY): 24 min  Charges:  $Gait Training: 8-22 mins $Therapeutic Exercise: 8-22 mins                     D. Scott Chala Gul PT, DPT 04/02/19, 5:35 PM

## 2019-04-03 DIAGNOSIS — M1000 Idiopathic gout, unspecified site: Secondary | ICD-10-CM | POA: Diagnosis not present

## 2019-04-03 DIAGNOSIS — R2681 Unsteadiness on feet: Secondary | ICD-10-CM | POA: Diagnosis not present

## 2019-04-03 DIAGNOSIS — E860 Dehydration: Secondary | ICD-10-CM | POA: Diagnosis present

## 2019-04-03 DIAGNOSIS — R0989 Other specified symptoms and signs involving the circulatory and respiratory systems: Secondary | ICD-10-CM | POA: Diagnosis not present

## 2019-04-03 DIAGNOSIS — I10 Essential (primary) hypertension: Secondary | ICD-10-CM | POA: Diagnosis not present

## 2019-04-03 DIAGNOSIS — I83028 Varicose veins of left lower extremity with ulcer other part of lower leg: Secondary | ICD-10-CM | POA: Diagnosis present

## 2019-04-03 DIAGNOSIS — Z515 Encounter for palliative care: Secondary | ICD-10-CM | POA: Diagnosis not present

## 2019-04-03 DIAGNOSIS — Z66 Do not resuscitate: Secondary | ICD-10-CM | POA: Diagnosis present

## 2019-04-03 DIAGNOSIS — K5900 Constipation, unspecified: Secondary | ICD-10-CM | POA: Diagnosis not present

## 2019-04-03 DIAGNOSIS — E1122 Type 2 diabetes mellitus with diabetic chronic kidney disease: Secondary | ICD-10-CM | POA: Diagnosis present

## 2019-04-03 DIAGNOSIS — I959 Hypotension, unspecified: Secondary | ICD-10-CM | POA: Diagnosis not present

## 2019-04-03 DIAGNOSIS — Z950 Presence of cardiac pacemaker: Secondary | ICD-10-CM | POA: Diagnosis not present

## 2019-04-03 DIAGNOSIS — N1832 Chronic kidney disease, stage 3b: Secondary | ICD-10-CM | POA: Diagnosis present

## 2019-04-03 DIAGNOSIS — Z743 Need for continuous supervision: Secondary | ICD-10-CM | POA: Diagnosis not present

## 2019-04-03 DIAGNOSIS — R0602 Shortness of breath: Secondary | ICD-10-CM | POA: Diagnosis not present

## 2019-04-03 DIAGNOSIS — I480 Paroxysmal atrial fibrillation: Secondary | ICD-10-CM | POA: Diagnosis present

## 2019-04-03 DIAGNOSIS — R279 Unspecified lack of coordination: Secondary | ICD-10-CM | POA: Diagnosis not present

## 2019-04-03 DIAGNOSIS — M109 Gout, unspecified: Secondary | ICD-10-CM | POA: Diagnosis present

## 2019-04-03 DIAGNOSIS — I5043 Acute on chronic combined systolic (congestive) and diastolic (congestive) heart failure: Secondary | ICD-10-CM | POA: Diagnosis not present

## 2019-04-03 DIAGNOSIS — W19XXXA Unspecified fall, initial encounter: Secondary | ICD-10-CM | POA: Diagnosis not present

## 2019-04-03 DIAGNOSIS — M6281 Muscle weakness (generalized): Secondary | ICD-10-CM | POA: Diagnosis not present

## 2019-04-03 DIAGNOSIS — R52 Pain, unspecified: Secondary | ICD-10-CM | POA: Diagnosis not present

## 2019-04-03 DIAGNOSIS — Z7901 Long term (current) use of anticoagulants: Secondary | ICD-10-CM | POA: Diagnosis not present

## 2019-04-03 DIAGNOSIS — I878 Other specified disorders of veins: Secondary | ICD-10-CM | POA: Diagnosis not present

## 2019-04-03 DIAGNOSIS — F17210 Nicotine dependence, cigarettes, uncomplicated: Secondary | ICD-10-CM | POA: Diagnosis present

## 2019-04-03 DIAGNOSIS — R531 Weakness: Secondary | ICD-10-CM | POA: Diagnosis not present

## 2019-04-03 DIAGNOSIS — I5022 Chronic systolic (congestive) heart failure: Secondary | ICD-10-CM | POA: Diagnosis present

## 2019-04-03 DIAGNOSIS — J449 Chronic obstructive pulmonary disease, unspecified: Secondary | ICD-10-CM | POA: Diagnosis present

## 2019-04-03 DIAGNOSIS — N170 Acute kidney failure with tubular necrosis: Secondary | ICD-10-CM | POA: Diagnosis present

## 2019-04-03 DIAGNOSIS — D649 Anemia, unspecified: Secondary | ICD-10-CM | POA: Diagnosis not present

## 2019-04-03 DIAGNOSIS — Z7189 Other specified counseling: Secondary | ICD-10-CM | POA: Diagnosis not present

## 2019-04-03 DIAGNOSIS — R6889 Other general symptoms and signs: Secondary | ICD-10-CM | POA: Diagnosis not present

## 2019-04-03 DIAGNOSIS — I83029 Varicose veins of left lower extremity with ulcer of unspecified site: Secondary | ICD-10-CM | POA: Diagnosis not present

## 2019-04-03 DIAGNOSIS — G9341 Metabolic encephalopathy: Secondary | ICD-10-CM | POA: Diagnosis present

## 2019-04-03 DIAGNOSIS — R296 Repeated falls: Secondary | ICD-10-CM | POA: Diagnosis not present

## 2019-04-03 DIAGNOSIS — N390 Urinary tract infection, site not specified: Secondary | ICD-10-CM | POA: Diagnosis not present

## 2019-04-03 DIAGNOSIS — N189 Chronic kidney disease, unspecified: Secondary | ICD-10-CM | POA: Diagnosis not present

## 2019-04-03 DIAGNOSIS — L97929 Non-pressure chronic ulcer of unspecified part of left lower leg with unspecified severity: Secondary | ICD-10-CM | POA: Diagnosis not present

## 2019-04-03 DIAGNOSIS — W19XXXD Unspecified fall, subsequent encounter: Secondary | ICD-10-CM | POA: Diagnosis not present

## 2019-04-03 DIAGNOSIS — N179 Acute kidney failure, unspecified: Secondary | ICD-10-CM | POA: Diagnosis not present

## 2019-04-03 DIAGNOSIS — I130 Hypertensive heart and chronic kidney disease with heart failure and stage 1 through stage 4 chronic kidney disease, or unspecified chronic kidney disease: Secondary | ICD-10-CM | POA: Diagnosis present

## 2019-04-03 DIAGNOSIS — Z930 Tracheostomy status: Secondary | ICD-10-CM | POA: Diagnosis not present

## 2019-04-03 DIAGNOSIS — Z23 Encounter for immunization: Secondary | ICD-10-CM | POA: Diagnosis not present

## 2019-04-03 DIAGNOSIS — W010XXA Fall on same level from slipping, tripping and stumbling without subsequent striking against object, initial encounter: Secondary | ICD-10-CM | POA: Diagnosis present

## 2019-04-03 DIAGNOSIS — I6389 Other cerebral infarction: Secondary | ICD-10-CM | POA: Diagnosis not present

## 2019-04-03 DIAGNOSIS — R319 Hematuria, unspecified: Secondary | ICD-10-CM | POA: Diagnosis not present

## 2019-04-03 DIAGNOSIS — Z95810 Presence of automatic (implantable) cardiac defibrillator: Secondary | ICD-10-CM | POA: Diagnosis not present

## 2019-04-03 DIAGNOSIS — K219 Gastro-esophageal reflux disease without esophagitis: Secondary | ICD-10-CM | POA: Diagnosis not present

## 2019-04-03 DIAGNOSIS — Z1159 Encounter for screening for other viral diseases: Secondary | ICD-10-CM | POA: Diagnosis not present

## 2019-04-03 DIAGNOSIS — Z7401 Bed confinement status: Secondary | ICD-10-CM | POA: Diagnosis not present

## 2019-04-03 DIAGNOSIS — M2550 Pain in unspecified joint: Secondary | ICD-10-CM | POA: Diagnosis not present

## 2019-04-03 DIAGNOSIS — E611 Iron deficiency: Secondary | ICD-10-CM | POA: Diagnosis not present

## 2019-04-03 DIAGNOSIS — R609 Edema, unspecified: Secondary | ICD-10-CM | POA: Diagnosis not present

## 2019-04-03 DIAGNOSIS — Y92009 Unspecified place in unspecified non-institutional (private) residence as the place of occurrence of the external cause: Secondary | ICD-10-CM | POA: Diagnosis not present

## 2019-04-03 DIAGNOSIS — I4891 Unspecified atrial fibrillation: Secondary | ICD-10-CM | POA: Diagnosis not present

## 2019-04-03 DIAGNOSIS — I2510 Atherosclerotic heart disease of native coronary artery without angina pectoris: Secondary | ICD-10-CM | POA: Diagnosis not present

## 2019-04-03 DIAGNOSIS — I83892 Varicose veins of left lower extremities with other complications: Secondary | ICD-10-CM | POA: Diagnosis not present

## 2019-04-03 DIAGNOSIS — I509 Heart failure, unspecified: Secondary | ICD-10-CM | POA: Diagnosis not present

## 2019-04-03 DIAGNOSIS — S0101XA Laceration without foreign body of scalp, initial encounter: Secondary | ICD-10-CM | POA: Diagnosis not present

## 2019-04-03 DIAGNOSIS — E569 Vitamin deficiency, unspecified: Secondary | ICD-10-CM | POA: Diagnosis not present

## 2019-04-03 DIAGNOSIS — R58 Hemorrhage, not elsewhere classified: Secondary | ICD-10-CM | POA: Diagnosis not present

## 2019-04-03 DIAGNOSIS — Z20822 Contact with and (suspected) exposure to covid-19: Secondary | ICD-10-CM | POA: Diagnosis present

## 2019-04-03 DIAGNOSIS — Z79899 Other long term (current) drug therapy: Secondary | ICD-10-CM | POA: Diagnosis not present

## 2019-04-03 DIAGNOSIS — Y9301 Activity, walking, marching and hiking: Secondary | ICD-10-CM | POA: Diagnosis present

## 2019-04-03 DIAGNOSIS — E119 Type 2 diabetes mellitus without complications: Secondary | ICD-10-CM | POA: Diagnosis not present

## 2019-04-03 DIAGNOSIS — E785 Hyperlipidemia, unspecified: Secondary | ICD-10-CM | POA: Diagnosis not present

## 2019-04-03 LAB — CBC
HCT: 30.3 % — ABNORMAL LOW (ref 39.0–52.0)
Hemoglobin: 8.4 g/dL — ABNORMAL LOW (ref 13.0–17.0)
MCH: 22.4 pg — ABNORMAL LOW (ref 26.0–34.0)
MCHC: 27.7 g/dL — ABNORMAL LOW (ref 30.0–36.0)
MCV: 80.8 fL (ref 80.0–100.0)
Platelets: 162 10*3/uL (ref 150–400)
RBC: 3.75 MIL/uL — ABNORMAL LOW (ref 4.22–5.81)
RDW: 24.9 % — ABNORMAL HIGH (ref 11.5–15.5)
WBC: 13.5 10*3/uL — ABNORMAL HIGH (ref 4.0–10.5)
nRBC: 0.3 % — ABNORMAL HIGH (ref 0.0–0.2)

## 2019-04-03 LAB — BASIC METABOLIC PANEL
Anion gap: 8 (ref 5–15)
BUN: 56 mg/dL — ABNORMAL HIGH (ref 8–23)
CO2: 25 mmol/L (ref 22–32)
Calcium: 8.5 mg/dL — ABNORMAL LOW (ref 8.9–10.3)
Chloride: 106 mmol/L (ref 98–111)
Creatinine, Ser: 1.79 mg/dL — ABNORMAL HIGH (ref 0.61–1.24)
GFR calc Af Amer: 44 mL/min — ABNORMAL LOW (ref >60)
GFR calc non Af Amer: 38 mL/min — ABNORMAL LOW (ref >60)
Glucose, Bld: 139 mg/dL — ABNORMAL HIGH (ref 70–99)
Potassium: 4.7 mmol/L (ref 3.5–5.1)
Sodium: 139 mmol/L (ref 135–145)

## 2019-04-03 LAB — MAGNESIUM: Magnesium: 1.7 mg/dL (ref 1.7–2.4)

## 2019-04-03 MED ORDER — OXYCODONE HCL 5 MG PO TABS: 5.0000 mg | ORAL_TABLET | Freq: Four times a day (QID) | ORAL | 0 refills | Status: AC | PRN

## 2019-04-03 NOTE — Plan of Care (Signed)
Report called to [facility], LPN at [facility].  Patient to d/c after 2pm today.  Patient denies pain or discomfort.  Sats 100% RA with trach.  Will continue to assess.

## 2019-04-03 NOTE — Discharge Summary (Addendum)
Physician Discharge Summary  Danny Glover TDD:[id number] DOB: 1950-12-28 DOA: 03/27/2019  PCP: System, Pcp Not In  Admit date: 03/27/2019 Discharge date: 04/03/2019  Admitted From: Home Disposition:  SNF - [facility]  Recommendations for Outpatient Follow-up:  Follow up with PCP in 1-2 weeks Please obtain BMP/CBC in one week Diuretics were held during admission due to AKI.  Renal function improved and diuretics were resumed upon discharge given chronic lower extremity edema.  Please monitor renal function and electrolytes with resuming diuretics.  Home Health: No  Equipment/Devices: None   Discharge Condition: Stable  CODE STATUS: Full  Diet recommendation: Regular  Brief/Interim Summary:  Danny Glover is a 69 y.o. Caucasian male with medical history significant of sCHF with ICD, CAD s/p CABG, Afib on Eliquis, tracheostomy (due to vocal cord paralysis after CABG), DM2, HTN, CDK-IIIb who presented after falling and hitting his head sustaining superficial scalp laceration. Had been having frequent falls recently with associated dizziness and balance diffculty.  No syncope.  Ambulates with walker at baseline.  In the ED, vitals stable.  Labs notable for K 5.5, BUN 129, Cr 2.65 (from 15.4 in May 2020), Hbg 8.2, microcytosos.  CT head showed a "Possible small age-indeterminate infarct of the inferior right basal ganglia".  Laceration was repaired in the ED.  Patient admitted for further work up.  Neurology consulted and did not attribute recent falls to the right basal ganglion lesion.  No MRI obtained due to AICD.  Myoclonus noted and significant uremia thought falls secondary to metabolic abnormalities and generalized weakness.  Patient was evaluated by PT who recommended SNF for rehab on discharge.  Patient clincally improved and stable for discharge today.   Discharge Diagnoses: Active Problems:   Falls frequently    Discharge Instructions   Discharge Instructions     Call MD for:   extreme fatigue   Complete by: As directed    Call MD for:  severe uncontrolled pain   Complete by: As directed    Call MD for:  temperature >100.4   Complete by: As directed    Diet - low sodium heart healthy   Complete by: As directed    Increase activity slowly   Complete by: As directed       Allergies as of 04/03/2019   No Known Allergies      Medication List     TAKE these medications    acetaminophen 325 MG tablet Commonly known as: TYLENOL Take 650 mg by mouth every 6 (six) hours as needed for mild pain.   allopurinol 100 MG tablet Commonly known as: ZYLOPRIM Take 100 mg by mouth daily.   apixaban 5 MG Tabs tablet Commonly known as: ELIQUIS Take 5 mg by mouth every 12 (twelve) hours.   atorvastatin 80 MG tablet Commonly known as: LIPITOR Take 40 mg by mouth at bedtime.   cholecalciferol 25 MCG (1000 UNIT) tablet Commonly known as: VITAMIN D Take 5,000 Units by mouth daily.   docusate sodium 100 MG capsule Commonly known as: COLACE Take 100 mg by mouth daily as needed for mild constipation.   ferrous sulfate 324 MG Tbec Take 324 mg by mouth.   gabapentin 100 MG capsule Commonly known as: NEURONTIN Take 100 mg by mouth every 12 (twelve) hours.   lisinopril 5 MG tablet Commonly known as: ZESTRIL Take 2.5 mg by mouth daily.   metolazone 2.5 MG tablet Commonly known as: ZAROXOLYN Take 2.5 mg by mouth daily as needed (fluid).   metoprolol 200  MG 24 hr tablet Commonly known as: TOPROL-XL Take 100 mg by mouth daily.   mirtazapine 7.5 MG tablet Commonly known as: REMERON Take 7.5 mg by mouth at bedtime.   multivitamin tablet Take 1 tablet by mouth daily.   oxyCODONE 5 MG immediate release tablet Commonly known as: Oxy IR/ROXICODONE Take 1 tablet (5 mg total) by mouth every 6 (six) hours as needed for up to 5 days for moderate pain.   pantoprazole 40 MG tablet Commonly known as: PROTONIX Take 40 mg by mouth daily.   torsemide 20 MG  tablet Commonly known as: DEMADEX Take 60 mg by mouth daily. May take 40 mg extra if needed       Contact information for after-discharge care     Destination     [facility] Preferred SNF .   Service: Skilled Nursing Contact information: [address] [place] [address] [phone]               No Known Allergies  Consultations: Neurology ENT for tracheostomy   Procedures/Studies: CT HEAD WO CONTRAST  Result Date: 03/27/2019 CLINICAL DATA:  Falls EXAM: CT HEAD WITHOUT CONTRAST TECHNIQUE: Contiguous axial images were obtained from the base of the skull through the vertex without intravenous contrast. COMPARISON:  None. FINDINGS: Brain: There is no acute intracranial hemorrhage, mass-effect, or edema. Gray-white differentiation is preserved. There is no extra-axial fluid collection. Ventricles and sulci are within normal limits in size and configuration. A small focus of hypoattenuation in the inferior right lentiform nucleus may reflect age-indeterminate small vessel infarct or prominent perivascular space. Vascular: There is atherosclerotic calcification at the skull base. Skull: Calvarium is unremarkable. Sinuses/Orbits: No acute finding. Other: Left posterior scalp hematoma. IMPRESSION: No evidence of acute intracranial injury. Possible small age-indeterminate infarct of the inferior right basal ganglia. Electronically Signed   By: Guadlupe Spanish M.D.   On: 03/27/2019 11:01   CT CERVICAL SPINE WO CONTRAST  Result Date: 03/27/2019 CLINICAL DATA:  Falls EXAM: CT CERVICAL SPINE WITHOUT CONTRAST TECHNIQUE: Multidetector CT imaging of the cervical spine was performed without intravenous contrast. Multiplanar CT image reconstructions were also generated. COMPARISON:  None. FINDINGS: Alignment: Anteroposterior alignment is maintained. Skull base and vertebrae: No acute fracture. Vertebral body heights are preserved. Soft tissues and spinal canal:  No prevertebral fluid or swelling. No visible canal hematoma. Disc levels:  Mild multilevel degenerative changes are present. Upper chest: Tracheostomy device is present.  No apical lung mass. Other: Calcified plaque at the common carotid bifurcations. IMPRESSION: No acute cervical spine fracture. Electronically Signed   By: Guadlupe Spanish M.D.   On: 03/27/2019 11:05   DG Chest Portable 1 View  Result Date: 03/27/2019 CLINICAL DATA:  Generalized weakness, falls EXAM: PORTABLE CHEST 1 VIEW COMPARISON:  07/17/2018 FINDINGS: Tracheostomy device is present. Chronic interstitial prominence. There is increased density at the right greater than left lung base. Stable cardiomediastinal contours. Left chest wall biventricular ICD is present. IMPRESSION: Increased density at the bases, which may reflect atelectasis/consolidation or scarring. Electronically Signed   By: Guadlupe Spanish M.D.   On: 03/27/2019 11:11     Subjective: Patient denies complaints including fever/chills, difficulty breathing, chest pain.  No acute events.  He states ready to get out of here today.   Discharge Exam: Vitals:   04/02/19 1626 04/03/19 0823  BP: (!) 114/54 136/63  Pulse: 69 64  Resp: 17 17  Temp: 98.6 F (37 C) 98.4 F (36.9 C)  SpO2: 96% 100%  Vitals:   04/02/19 0014 04/02/19 0808 04/02/19 1626 04/03/19 0823  BP: 112/66 137/64 (!) 114/54 136/63  Pulse: 70 67 69 64  Resp: 14 16 17 17   Temp: 98.2 F (36.8 C)  98.6 F (37 C) 98.4 F (36.9 C)  TempSrc: Oral  Oral Oral  SpO2: 96% 97% 96% 100%  Weight:      Height:        General: Pt is alert, awake, not in acute distress Cardiovascular: RRR, S1/S2 +, no rubs, no gallops Respiratory: CTA bilaterally, no wheezing, no rhonchi Abdominal: Soft, NT, ND, bowel sounds + Extremities: no edema, no cyanosis    The results of significant diagnostics from this hospitalization (including imaging, microbiology, ancillary and laboratory) are listed below for  reference.     Microbiology: Recent Results (from the past 240 hour(s))  SARS CORONAVIRUS 2 (TAT 6-24 HRS) Nasopharyngeal Nasopharyngeal Swab     Status: None   Collection Time: 03/27/19 11:54 AM   Specimen: Nasopharyngeal Swab  Result Value Ref Range Status   SARS Coronavirus 2 NEGATIVE NEGATIVE Final    Comment: (NOTE) SARS-CoV-2 target nucleic acids are NOT DETECTED. The SARS-CoV-2 RNA is generally detectable in upper and lower respiratory specimens during the acute phase of infection. Negative results do not preclude SARS-CoV-2 infection, do not rule out co-infections with other pathogens, and should not be used as the sole basis for treatment or other patient management decisions. Negative results must be combined with clinical observations, patient history, and epidemiological information. The expected result is Negative. Fact Sheet for Patients: 03/29/19 Fact Sheet for Healthcare Providers: HairSlick.no This test is not yet approved or cleared by the [place] FDA and  has been authorized for detection and/or diagnosis of SARS-CoV-2 by FDA under an Emergency Use Authorization (EUA). This EUA will remain  in effect (meaning this test can be used) for the duration of the COVID-19 declaration under Section 56 4(b)(1) of the Act, 21 U.S.C. section 360bbb-3(b)(1), unless the authorization is terminated or revoked sooner. Performed at [facility], [address] [place], [place]   SARS CORONAVIRUS 2 (TAT 6-24 HRS) Nasopharyngeal Nasopharyngeal Swab     Status: None   Collection Time: 04/02/19  3:04 PM   Specimen: Nasopharyngeal Swab  Result Value Ref Range Status   SARS Coronavirus 2 NEGATIVE NEGATIVE Final    Comment: (NOTE) SARS-CoV-2 target nucleic acids are NOT DETECTED. The SARS-CoV-2 RNA is generally detectable in upper and lower respiratory specimens during the acute phase of  infection. Negative results do not preclude SARS-CoV-2 infection, do not rule out co-infections with other pathogens, and should not be used as the sole basis for treatment or other patient management decisions. Negative results must be combined with clinical observations, patient history, and epidemiological information. The expected result is Negative. Fact Sheet for Patients: 04/04/19 Fact Sheet for Healthcare Providers: HairSlick.no This test is not yet approved or cleared by the [place] FDA and  has been authorized for detection and/or diagnosis of SARS-CoV-2 by FDA under an Emergency Use Authorization (EUA). This EUA will remain  in effect (meaning this test can be used) for the duration of the COVID-19 declaration under Section 56 4(b)(1) of the Act, 21 U.S.C. section 360bbb-3(b)(1), unless the authorization is terminated or revoked sooner. Performed at [facility], [address] [place], [place]      Labs: BNP (last 3 results) Recent Labs    07/17/18 0445  BNP 1,207.0*  Basic Metabolic Panel: Recent Labs  Lab 03/30/19 0551 03/31/19 0915 04/01/19 0510 04/02/19 0459 04/03/19 0430  NA 139 138 140 142 139  K 5.3* 4.9 4.8 4.5 4.7  CL 106 104 106 104 106  CO2 24 26 26 29 25   GLUCOSE 137* 115* 138* 100* 139*  BUN 89* 76* 62* 53* 56*  CREATININE 1.83* 1.74* 1.59* 1.47* 1.79*  CALCIUM 8.8* 9.0 8.7* 8.9 8.5*  MG 1.8 1.8 1.8 1.7 1.7   Liver Function Tests: Recent Labs  Lab 03/27/19 1401  AST 35  ALT 16  ALKPHOS 77  BILITOT 1.6*  PROT 8.1  ALBUMIN 3.1*   No results for input(s): LIPASE, AMYLASE in the last 168 hours. No results for input(s): AMMONIA in the last 168 hours. CBC: Recent Labs  Lab 03/27/19 1154 03/28/19 0550 03/30/19 0551 03/31/19 0915 04/01/19 0510 04/02/19 0459 04/03/19 0430  WBC 10.7*   < > 7.9 9.0 9.8 10.0 13.5*  NEUTROABS 8.7*  --   --   --   --    --   --   HGB 8.2*   < > 8.1* 9.0* 8.6* 8.6* 8.4*  HCT 30.5*   < > 28.8* 32.6* 31.9* 32.0* 30.3*  MCV 75.9*   < > 77.0* 78.9* 79.4* 80.2 80.8  PLT 160   < > 165 180 169 174 162   < > = values in this interval not displayed.   Cardiac Enzymes: No results for input(s): CKTOTAL, CKMB, CKMBINDEX, TROPONINI in the last 168 hours. BNP: Invalid input(s): POCBNP CBG: No results for input(s): GLUCAP in the last 168 hours. D-Dimer No results for input(s): DDIMER in the last 72 hours. Hgb A1c No results for input(s): HGBA1C in the last 72 hours. Lipid Profile No results for input(s): CHOL, HDL, LDLCALC, TRIG, CHOLHDL, LDLDIRECT in the last 72 hours. Thyroid function studies No results for input(s): TSH, T4TOTAL, T3FREE, THYROIDAB in the last 72 hours.  Invalid input(s): FREET3 Anemia work up No results for input(s): VITAMINB12, FOLATE, FERRITIN, TIBC, IRON, RETICCTPCT in the last 72 hours. Urinalysis No results found for: COLORURINE, APPEARANCEUR, LABSPEC, PHURINE, GLUCOSEU, HGBUR, BILIRUBINUR, KETONESUR, PROTEINUR, UROBILINOGEN, NITRITE, LEUKOCYTESUR Sepsis Labs Invalid input(s): PROCALCITONIN,  WBC,  LACTICIDVEN Microbiology Recent Results (from the past 240 hour(s))  SARS CORONAVIRUS 2 (TAT 6-24 HRS) Nasopharyngeal Nasopharyngeal Swab     Status: None   Collection Time: 03/27/19 11:54 AM   Specimen: Nasopharyngeal Swab  Result Value Ref Range Status   SARS Coronavirus 2 NEGATIVE NEGATIVE Final    Comment: (NOTE) SARS-CoV-2 target nucleic acids are NOT DETECTED. The SARS-CoV-2 RNA is generally detectable in upper and lower respiratory specimens during the acute phase of infection. Negative results do not preclude SARS-CoV-2 infection, do not rule out co-infections with other pathogens, and should not be used as the sole basis for treatment or other patient management decisions. Negative results must be combined with clinical observations, patient history, and epidemiological  information. The expected result is Negative. Fact Sheet for Patients: HairSlick.nohttps://www.fda.gov/media/138098/download Fact Sheet for Healthcare Providers: quierodirigir.comhttps://www.fda.gov/media/138095/download This test is not yet approved or cleared by the [place] FDA and  has been authorized for detection and/or diagnosis of SARS-CoV-2 by FDA under an Emergency Use Authorization (EUA). This EUA will remain  in effect (meaning this test can be used) for the duration of the COVID-19 declaration under Section 56 4(b)(1) of the Act, 21 U.S.C. section 360bbb-3(b)(1), unless the authorization is terminated or revoked sooner. Performed at [facility], [address], [place], [place] [address]   SARS CORONAVIRUS 2 (TAT 6-24 HRS) Nasopharyngeal Nasopharyngeal Swab     Status: None   Collection Time: 04/02/19  3:04 PM   Specimen: Nasopharyngeal Swab  Result Value Ref Range Status   SARS Coronavirus 2 NEGATIVE NEGATIVE Final    Comment: (NOTE) SARS-CoV-2 target nucleic acids are NOT DETECTED. The SARS-CoV-2 RNA is generally detectable in upper and lower respiratory specimens during the acute phase of infection. Negative results do not preclude SARS-CoV-2 infection, do not rule out co-infections with other pathogens, and should not be used as the sole basis for treatment or other patient management decisions. Negative results must be combined with clinical observations, patient history, and epidemiological information. The expected result is Negative. Fact Sheet for Patients: HairSlick.no Fact Sheet for Healthcare Providers: quierodirigir.com This test is not yet approved or cleared by the [place] FDA and  has been authorized for detection and/or diagnosis of SARS-CoV-2 by FDA under an Emergency Use Authorization (EUA). This EUA will remain  in effect (meaning this test can be used) for the duration of the COVID-19 declaration under  Section 56 4(b)(1) of the Act, 21 U.S.C. section 360bbb-3(b)(1), unless the authorization is terminated or revoked sooner. Performed at [facility], [address] [place], [place] [address]      Time coordinating discharge: Over 30 minutes  SIGNED:   Pennie Banter, DO [facility] 04/03/2019, 9:23 AM   If 7PM-7AM, please contact night-coverage [email or web address]

## 2019-04-03 NOTE — TOC Transition Note (Signed)
Transition of Care [facility]) - CM/SW Discharge Note   Patient Details  Name: Graylen Noboa MRN: [id number] Date of Birth: 27-Jul-1950  [facility] CM/SW Contact:  Barrie Dunker, RN Phone Number: 04/03/2019, 10:21 AM   Clinical Narrative:    The patient will DC today after 2 PM to [facility] room 604 via EMS transport.  Family Lynnea Ferrier has been made aware   Final next level of care: Skilled Nursing Facility Barriers to Discharge: Barriers Resolved   Patient Goals and CMS Choice        Discharge Placement              Patient chooses bed at: [facility] [place] Patient to be transferred to facility by: EMS Name of family member notified: Doreen Salvage Patient and family notified of of transfer: 04/03/19  Discharge Plan and Services     Post Acute Care Choice: Skilled Nursing Facility                               Social Determinants of Health (SDOH) Interventions     Readmission Risk Interventions No flowsheet data found.

## 2019-04-03 NOTE — Progress Notes (Signed)
EMS arrived for patient transport.  No s/s of distress noted.  Metal trach in patient belongings bag with other trach supplies.  Cell phone and charger in 2nd belongings bag, with reading glasses.

## 2019-04-07 DIAGNOSIS — M6281 Muscle weakness (generalized): Secondary | ICD-10-CM | POA: Diagnosis not present

## 2019-04-07 DIAGNOSIS — W19XXXD Unspecified fall, subsequent encounter: Secondary | ICD-10-CM | POA: Diagnosis not present

## 2019-04-07 DIAGNOSIS — R2681 Unsteadiness on feet: Secondary | ICD-10-CM | POA: Diagnosis not present

## 2019-04-07 DIAGNOSIS — I2510 Atherosclerotic heart disease of native coronary artery without angina pectoris: Secondary | ICD-10-CM | POA: Diagnosis not present

## 2019-04-07 DIAGNOSIS — I509 Heart failure, unspecified: Secondary | ICD-10-CM | POA: Diagnosis not present

## 2019-04-15 ENCOUNTER — Emergency Department: Payer: No Typology Code available for payment source

## 2019-04-15 ENCOUNTER — Other Ambulatory Visit: Payer: Self-pay

## 2019-04-15 ENCOUNTER — Emergency Department
Admission: EM | Admit: 2019-04-15 | Discharge: 2019-04-15 | Disposition: A | Discharge status: Home / Self Care | Payer: No Typology Code available for payment source | Source: Home / Self Care | Attending: Emergency Medicine | Admitting: Emergency Medicine

## 2019-04-15 DIAGNOSIS — Z7984 Long term (current) use of oral hypoglycemic drugs: Secondary | ICD-10-CM | POA: Insufficient documentation

## 2019-04-15 DIAGNOSIS — Y9389 Activity, other specified: Secondary | ICD-10-CM | POA: Insufficient documentation

## 2019-04-15 DIAGNOSIS — N170 Acute kidney failure with tubular necrosis: Secondary | ICD-10-CM | POA: Diagnosis not present

## 2019-04-15 DIAGNOSIS — W19XXXA Unspecified fall, initial encounter: Secondary | ICD-10-CM

## 2019-04-15 DIAGNOSIS — S0101XA Laceration without foreign body of scalp, initial encounter: Secondary | ICD-10-CM | POA: Insufficient documentation

## 2019-04-15 DIAGNOSIS — Y92199 Unspecified place in other specified residential institution as the place of occurrence of the external cause: Secondary | ICD-10-CM | POA: Insufficient documentation

## 2019-04-15 DIAGNOSIS — I110 Hypertensive heart disease with heart failure: Secondary | ICD-10-CM | POA: Insufficient documentation

## 2019-04-15 DIAGNOSIS — W1839XA Other fall on same level, initial encounter: Secondary | ICD-10-CM | POA: Insufficient documentation

## 2019-04-15 DIAGNOSIS — I5022 Chronic systolic (congestive) heart failure: Secondary | ICD-10-CM | POA: Insufficient documentation

## 2019-04-15 DIAGNOSIS — Y999 Unspecified external cause status: Secondary | ICD-10-CM | POA: Insufficient documentation

## 2019-04-15 DIAGNOSIS — E860 Dehydration: Secondary | ICD-10-CM | POA: Diagnosis not present

## 2019-04-15 DIAGNOSIS — E119 Type 2 diabetes mellitus without complications: Secondary | ICD-10-CM | POA: Insufficient documentation

## 2019-04-15 DIAGNOSIS — Z79899 Other long term (current) drug therapy: Secondary | ICD-10-CM | POA: Insufficient documentation

## 2019-04-15 DIAGNOSIS — I10 Essential (primary) hypertension: Secondary | ICD-10-CM | POA: Diagnosis not present

## 2019-04-15 DIAGNOSIS — I480 Paroxysmal atrial fibrillation: Secondary | ICD-10-CM | POA: Insufficient documentation

## 2019-04-15 DIAGNOSIS — Z7901 Long term (current) use of anticoagulants: Secondary | ICD-10-CM | POA: Insufficient documentation

## 2019-04-15 HISTORY — DX: Gout, unspecified: M10.9

## 2019-04-15 HISTORY — DX: Gastro-esophageal reflux disease without esophagitis: K21.9

## 2019-04-15 HISTORY — DX: Hyperlipidemia, unspecified: E78.5

## 2019-04-15 LAB — BASIC METABOLIC PANEL
Anion gap: 11 (ref 5–15)
BUN: 97 mg/dL — ABNORMAL HIGH (ref 8–23)
CO2: 27 mmol/L (ref 22–32)
Calcium: 8.9 mg/dL (ref 8.9–10.3)
Chloride: 99 mmol/L (ref 98–111)
Creatinine, Ser: 1.59 mg/dL — ABNORMAL HIGH (ref 0.61–1.24)
GFR calc Af Amer: 50 mL/min — ABNORMAL LOW (ref >60)
GFR calc non Af Amer: 43 mL/min — ABNORMAL LOW (ref >60)
Glucose, Bld: 151 mg/dL — ABNORMAL HIGH (ref 70–99)
Potassium: 4.5 mmol/L (ref 3.5–5.1)
Sodium: 137 mmol/L (ref 135–145)

## 2019-04-15 LAB — CBC
HCT: 32.3 % — ABNORMAL LOW (ref 39.0–52.0)
Hemoglobin: 9.3 g/dL — ABNORMAL LOW (ref 13.0–17.0)
MCH: 23.7 pg — ABNORMAL LOW (ref 26.0–34.0)
MCHC: 28.8 g/dL — ABNORMAL LOW (ref 30.0–36.0)
MCV: 82.4 fL (ref 80.0–100.0)
Platelets: 176 10*3/uL (ref 150–400)
RBC: 3.92 MIL/uL — ABNORMAL LOW (ref 4.22–5.81)
RDW: 28.8 % — ABNORMAL HIGH (ref 11.5–15.5)
WBC: 5.1 10*3/uL (ref 4.0–10.5)
nRBC: 0 % (ref 0.0–0.2)

## 2019-04-15 NOTE — ED Notes (Signed)
MD Veronese at bedside  

## 2019-04-15 NOTE — ED Notes (Signed)
Pt oob to br with 2 assist.

## 2019-04-15 NOTE — ED Notes (Signed)
Pt taken to CT.

## 2019-04-15 NOTE — ED Notes (Signed)
This RN reviewed discharge instructions, follow-up care, suture/wound care with patient and patient's caregiver, Lanora Manis, from [employer]. Patient and caregiver verbalized understanding of information discussed.

## 2019-04-15 NOTE — ED Triage Notes (Signed)
Patient coming [facility] from [facility] for unwitnessed fall. Patient has laceration to posterior head. Patient reports he is on blood thinners. Patient AO X 4.

## 2019-04-15 NOTE — ED Provider Notes (Signed)
[facility] Emergency Department Provider Note  ____________________________________________  Time seen: Approximately 3:17 AM  I have reviewed the triage vital signs and the nursing notes.   HISTORY  Chief Complaint Fall   HPI Danny Glover is a 69 y.o. male with a history of paroxysmal atrial fibrillation on Eliquis, CHF, ICD, tracheostomy dependent, diabetes, hypertension, CAD who presents for evaluation after mechanical fall.  Patient reports that he was walking to the bathroom when he lost his balance and fell.  Patient remembers the entire incident and is able to describe it.  He reports that the fall was mechanical in nature and later that he just lost his balance.  He denies dizziness, chest pain, headache, LOC.  He hit the back of his head onto the floor.  Is complaining of mild throbbing pain.  He denies neck pain or back pain, hip pain, upper extremity pain.   Past Medical History:  Diagnosis Date  . CHF (congestive heart failure) (HCC)   . Diabetes mellitus without complication (HCC)   . GERD (gastroesophageal reflux disease)   . Gout   . Hyperlipemia     Patient Active Problem List   Diagnosis Date Noted  . Falls frequently 03/27/2019  . Paroxysmal atrial fibrillation (HCC) 01/03/2015  . Chronic obstructive pulmonary disease (HCC) 01/01/2015  . Chronic systolic (congestive) heart failure (HCC) 01/01/2015  . Controlled type 2 diabetes mellitus without complication (HCC) 01/01/2015  . Coronary artery disease involving native coronary artery 01/01/2015  . Essential hypertension 01/01/2015  . History of vocal cord paralysis 01/01/2015  . Hyperlipidemia 01/01/2015  . Tobacco abuse 01/01/2015  . Tracheostomy dependent (HCC) 01/01/2015  . Biventricular ICD (implantable cardioverter-defibrillator) in place 09/01/2014    Past Surgical History:  Procedure Laterality Date  . PACEMAKER PLACEMENT    . TRACHEOSTOMY      Prior to Admission  medications   Medication Sig Start Date End Date Taking? Authorizing Provider  acetaminophen (TYLENOL) 325 MG tablet Take 650 mg by mouth every 6 (six) hours as needed for mild pain.    [provider]  allopurinol (ZYLOPRIM) 100 MG tablet Take 100 mg by mouth daily.    [provider]  apixaban (ELIQUIS) 5 MG TABS tablet Take 5 mg by mouth every 12 (twelve) hours.    [provider]  atorvastatin (LIPITOR) 80 MG tablet Take 40 mg by mouth at bedtime.     [provider]  cholecalciferol (VITAMIN D) 25 MCG (1000 UNIT) tablet Take 5,000 Units by mouth daily.    [provider]  docusate sodium (COLACE) 100 MG capsule Take 100 mg by mouth daily as needed for mild constipation.    [provider]  ferrous sulfate 324 MG TBEC Take 324 mg by mouth.    [provider]  gabapentin (NEURONTIN) 100 MG capsule Take 100 mg by mouth every 12 (twelve) hours.    [provider]  lisinopril (ZESTRIL) 5 MG tablet Take 2.5 mg by mouth daily.    [provider]  metolazone (ZAROXOLYN) 2.5 MG tablet Take 2.5 mg by mouth daily as needed (fluid).    [provider]  metoprolol (TOPROL-XL) 200 MG 24 hr tablet Take 100 mg by mouth daily.    [provider]  mirtazapine (REMERON) 7.5 MG tablet Take 7.5 mg by mouth at bedtime.    [provider]  Multiple Vitamin (MULTIVITAMIN) tablet Take 1 tablet by mouth daily.    [provider]  pantoprazole (PROTONIX)  40 MG tablet Take 40 mg by mouth daily.    [provider]  torsemide (DEMADEX) 20 MG tablet Take 60 mg by mouth daily. May take 40 mg extra if needed    [provider]    Allergies Patient has no known allergies.  Family History  Problem Relation Age of Onset  . Hypertension Mother   . Heart attack Father     Social History Social History   Tobacco Use  . Smoking status: Current Every Day Smoker    Packs/day: 1.00     Types: Cigarettes  . Smokeless tobacco: Never Used  Substance Use Topics  . Alcohol use: Never  . Drug use: Never    Review of Systems  Constitutional: Negative for fever. Eyes: Negative for visual changes. ENT: Negative for facial injury or neck injury Cardiovascular: Negative for chest injury. Respiratory: Negative for shortness of breath. Negative for chest wall injury. Gastrointestinal: Negative for abdominal pain or injury. Genitourinary: Negative for dysuria. Musculoskeletal: Negative for back injury, negative for arm or leg pain. Skin: Negative for laceration/abrasions. Neurological: + head injury.   ____________________________________________   PHYSICAL EXAM:  VITAL SIGNS: ED Triage Vitals  Enc Vitals Group     BP 04/15/19 0234 122/70     Pulse Rate 04/15/19 0234 70     Resp 04/15/19 0234 17     Temp 04/15/19 0304 98.8 F (37.1 C)     Temp Source 04/15/19 0304 Oral     SpO2 04/15/19 0234 100 %     Weight 04/15/19 0230 199 lb 15.3 oz (90.7 kg)     Height --      Head Circumference --      Peak Flow --      Pain Score 04/15/19 0229 4     Pain Loc --      Pain Edu? --      Excl. in GC? --     Full spinal precautions maintained throughout the trauma exam. Constitutional: Alert and oriented. No acute distress. Does not appear intoxicated. HEENT Head: Normocephalic. 4cm laceration in the occipital region Face: No facial bony tenderness. Stable midface Ears: No hemotympanum bilaterally. No Battle sign Eyes: No eye injury. PERRL. No raccoon eyes Nose: Nontender. No epistaxis. No rhinorrhea Mouth/Throat: Mucous membranes are moist. No oropharyngeal blood. No dental injury. Airway patent without stridor. Normal voice. Neck: no C-collar. No midline c-spine tenderness.  Cardiovascular: Normal rate, regular rhythm. Normal and symmetric distal pulses are present in all extremities. Pulmonary/Chest: Chest wall is stable and nontender to palpation/compression. Normal  respiratory effort. Breath sounds are normal. No crepitus.  Abdominal: Soft, nontender, non distended. Musculoskeletal: Nontender with normal full range of motion in all extremities. No deformities. No thoracic or lumbar midline spinal tenderness. Pelvis is stable. Skin: Skin is warm, dry and intact. No abrasions or contutions. Psychiatric: Speech and behavior are appropriate. Neurological: Normal speech and language. Moves all extremities to command. No gross focal neurologic deficits are appreciated.  Glascow Coma Score: 4 - Opens eyes on own 6 - Follows simple motor commands 5 - Alert and oriented GCS: 15   ____________________________________________   LABS (all labs ordered are listed, but only abnormal results are displayed)  Labs Reviewed  CBC - Abnormal; Notable for the following components:      Result Value   RBC 3.92 (*)    Hemoglobin 9.3 (*)    HCT 32.3 (*)    MCH 23.7 (*)    MCHC 28.8 (*)  RDW 28.8 (*)    All other components within normal limits  BASIC METABOLIC PANEL - Abnormal; Notable for the following components:   Glucose, Bld 151 (*)    BUN 97 (*)    Creatinine, Ser 1.59 (*)    GFR calc non Af Amer 43 (*)    GFR calc Af Amer 50 (*)    All other components within normal limits   ____________________________________________  EKG  ED ECG REPORT I, Nita Sickle, the attending physician, personally viewed and interpreted this ECG.  Normal sinus rhythm, rate of 70, right bundle branch block, LAFB, prolonged QTC, left axis deviation, no ST elevations.  Unchanged from prior. ____________________________________________  RADIOLOGY  I have personally reviewed the images performed during this visit and I agree with the Radiologist's read.   Interpretation by Radiologist:  CT Head Wo Contrast  Result Date: 04/15/2019 CLINICAL DATA:  Neck trauma, unwitnessed fall, posterior scalp laceration EXAM: CT HEAD WITHOUT CONTRAST CT CERVICAL SPINE WITHOUT  CONTRAST TECHNIQUE: Multidetector CT imaging of the head and cervical spine was performed following the standard protocol without intravenous contrast. Multiplanar CT image reconstructions of the cervical spine were also generated. COMPARISON:  CT 03/27/2019 FINDINGS: CT HEAD FINDINGS Brain: Small stable focus of hypoattenuation in the right basal ganglia likely reflective prior lacunar infarct (2/10). No evidence of acute infarction, hemorrhage, hydrocephalus, extra-axial collection or mass lesion/mass effect. Symmetric prominence of the ventricles, cisterns and sulci compatible with parenchymal volume loss. Patchy areas of white matter hypoattenuation are most compatible with chronic microvascular angiopathy. Vascular: Atherosclerotic calcification of the carotid siphons and intradural vertebral arteries. No hyperdense vessel. Skull: Left parieto-occipital scalp swelling and small hematoma measuring to mm maximal thickness with overlying laceration and small amount of subcutaneous gas with skin staples and bandaging present at time of exam. Sinuses/Orbits: Paranasal sinuses and mastoid air cells are predominantly clear. Included orbital structures are unremarkable. Other: Edentulous with mandibular prognathism. CT CERVICAL SPINE FINDINGS Alignment: Straightening of the normal cervical lordosis. Craniocervical and atlantoaxial articulations are normally aligned. No abnormally widened, perched or jumped facets. Skull base and vertebrae: No acute fracture. No primary bone lesion or focal pathologic process. Soft tissues and spinal canal: No pre or paravertebral fluid or swelling. No visible canal hematoma. Disc levels: Multilevel cervical spondylitic changes are present as well as features suggesting early ossification of the posterior longitudinal ligament (OPLL). No significant canal stenosis. Minimal uncinate spurring without resulting neural foraminal narrowing. Upper chest: Biapical pleuroparenchymal scarring  and emphysematous changes. No acute abnormality of the lung apices. Tracheostomy noted in place. Left chest wall pacer pack is noted. Other: Normal thyroid. IMPRESSION: 1. Left parieto-occipital scalp swelling and small hematoma. No underlying skull fracture. 2. No acute intracranial abnormality. 3. Chronic microvascular angiopathy and mild parenchymal volume loss. Likely remote lacunar infarct in the right basal ganglia. 4. No evidence of acute fracture malalignment of the cervical spine. 5. Mild-to-moderate multilevel cervical spondylitic changes as well as features suggesting early ossification of the posterior longitudinal ligament (OPLL). Electronically Signed   By: Kreg Shropshire M.D.   On: 04/15/2019 03:18   CT Cervical Spine Wo Contrast  Result Date: 04/15/2019 CLINICAL DATA:  Neck trauma, unwitnessed fall, posterior scalp laceration EXAM: CT HEAD WITHOUT CONTRAST CT CERVICAL SPINE WITHOUT CONTRAST TECHNIQUE: Multidetector CT imaging of the head and cervical spine was performed following the standard protocol without intravenous contrast. Multiplanar CT image reconstructions of the cervical spine were also generated. COMPARISON:  CT 03/27/2019 FINDINGS: CT HEAD FINDINGS Brain:  Small stable focus of hypoattenuation in the right basal ganglia likely reflective prior lacunar infarct (2/10). No evidence of acute infarction, hemorrhage, hydrocephalus, extra-axial collection or mass lesion/mass effect. Symmetric prominence of the ventricles, cisterns and sulci compatible with parenchymal volume loss. Patchy areas of white matter hypoattenuation are most compatible with chronic microvascular angiopathy. Vascular: Atherosclerotic calcification of the carotid siphons and intradural vertebral arteries. No hyperdense vessel. Skull: Left parieto-occipital scalp swelling and small hematoma measuring to mm maximal thickness with overlying laceration and small amount of subcutaneous gas with skin staples and bandaging  present at time of exam. Sinuses/Orbits: Paranasal sinuses and mastoid air cells are predominantly clear. Included orbital structures are unremarkable. Other: Edentulous with mandibular prognathism. CT CERVICAL SPINE FINDINGS Alignment: Straightening of the normal cervical lordosis. Craniocervical and atlantoaxial articulations are normally aligned. No abnormally widened, perched or jumped facets. Skull base and vertebrae: No acute fracture. No primary bone lesion or focal pathologic process. Soft tissues and spinal canal: No pre or paravertebral fluid or swelling. No visible canal hematoma. Disc levels: Multilevel cervical spondylitic changes are present as well as features suggesting early ossification of the posterior longitudinal ligament (OPLL). No significant canal stenosis. Minimal uncinate spurring without resulting neural foraminal narrowing. Upper chest: Biapical pleuroparenchymal scarring and emphysematous changes. No acute abnormality of the lung apices. Tracheostomy noted in place. Left chest wall pacer pack is noted. Other: Normal thyroid. IMPRESSION: 1. Left parieto-occipital scalp swelling and small hematoma. No underlying skull fracture. 2. No acute intracranial abnormality. 3. Chronic microvascular angiopathy and mild parenchymal volume loss. Likely remote lacunar infarct in the right basal ganglia. 4. No evidence of acute fracture malalignment of the cervical spine. 5. Mild-to-moderate multilevel cervical spondylitic changes as well as features suggesting early ossification of the posterior longitudinal ligament (OPLL). Electronically Signed   By: Lovena Le M.D.   On: 04/15/2019 03:18      ____________________________________________   PROCEDURES  Procedure(s) performed:yes .Marland KitchenLaceration Repair  Date/Time: 04/15/2019 3:20 AM Performed by: Rudene Re, MD Authorized by: Rudene Re, MD   Consent:    Consent obtained:  Verbal   Consent given by:  Patient   Risks  discussed:  Infection, pain, retained foreign body, poor cosmetic result and poor wound healing Anesthesia (see MAR for exact dosages):    Anesthesia method:  None Laceration details:    Location:  Scalp   Scalp location:  Occipital   Length (cm):  4 Repair type:    Repair type:  Simple Exploration:    Hemostasis achieved with:  Direct pressure   Wound exploration: entire depth of wound probed and visualized     Wound extent: no fascia violation noted, no foreign bodies/material noted and no underlying fracture noted     Contaminated: no   Treatment:    Area cleansed with:  Saline   Amount of cleaning:  Extensive   Irrigation solution:  Sterile saline   Visualized foreign bodies/material removed: no   Skin repair:    Repair method:  Staples   Number of staples:  4 Approximation:    Approximation:  Close Post-procedure details:    Dressing:  Open (no dressing)   Patient tolerance of procedure:  Tolerated well, no immediate complications   Critical Care performed:  None ____________________________________________   INITIAL IMPRESSION / ASSESSMENT AND PLAN / ED COURSE   69 y.o. male with a history of paroxysmal atrial fibrillation on Eliquis, CHF, ICD, tracheostomy dependent, diabetes, hypertension, CAD who presents for evaluation after mechanical fall.  Patient  sustained a scalp laceration at the same location on his last visit a month ago.  Still had the is in place.  Stitches were removed and staples placed instead.  Wound was clean with no foreign bodies.  CT head and neck with no acute findings.  Labs showing stable anemia and chronic kidney disease with no significant electrolyte abnormalities.  EKG showing no ischemia or dysrhythmias.  Tetanus up-to-date.  Discussed wound care and signs of infection with patient.  Patient is stable for discharge back to his facility.      Please note:  Patient was evaluated in Emergency Department today for the symptoms described in the  history of present illness. Patient was evaluated in the context of the global COVID-19 pandemic, which necessitated consideration that the patient might be at risk for infection with the SARS-CoV-2 virus that causes COVID-19. Institutional protocols and algorithms that pertain to the evaluation of patients at risk for COVID-19 are in a state of rapid change based on information released by regulatory bodies including the CDC and federal and state organizations. These policies and algorithms were followed during the patient's care in the ED.  Some ED evaluations and interventions may be delayed as a result of limited staffing during the pandemic.   As part of my medical decision making, I reviewed the following data within the electronic MEDICAL RECORD NUMBER Nursing notes reviewed and incorporated, Labs reviewed , EKG interpreted , Old chart reviewed, Radiograph reviewed , Notes from prior ED visits and [place] Controlled Substance Database   ____________________________________________   FINAL CLINICAL IMPRESSION(S) / ED DIAGNOSES   Final diagnoses:  Fall, initial encounter  Laceration of scalp, initial encounter      NEW MEDICATIONS STARTED DURING THIS VISIT:  ED Discharge Orders    None       Note:  This document was prepared using Dragon voice recognition software and may include unintentional dictation errors.    Don Perking, [place], MD 04/15/19 [phone]

## 2019-04-15 NOTE — Discharge Instructions (Signed)
Keep laceration dry and clean. Wash with warm water and soap. Apply topical bacitracin.  You received 4 staples stithces that must be removed in 7 days.  Watch for signs of infection: pus, redness of the skin surrounding it, or fever. If these develop see your doctor or return to the ER for antibiotics.

## 2019-04-15 NOTE — Progress Notes (Signed)
RN called RT to patient bedside for assistance with trache. Patient requesting to be suctioned. Trache site is clean, no bleeding or oozing secretions. Trache secured with clean dry ties and clean, dry dressing. Patient suctioned with small amount of thick tan/white secretions. Inner cannula has thick tan and blood tinged secretions. RN obtaining spare #4 cuffless trache and inner cannulas for patient bedside. Inner cannula will be replaced once at bedside. Patient states he is able to change his inner cannula but does not know how, patient educated on trache and use of inner cannulas. RN at bedside for trache education as well.

## 2019-04-17 ENCOUNTER — Inpatient Hospital Stay
Admission: EM | Admit: 2019-04-17 | Discharge: 2019-04-20 | DRG: 682 | Disposition: A | Discharge status: Other Acute Inpatient Hospital | Payer: No Typology Code available for payment source | Attending: Internal Medicine | Admitting: Internal Medicine

## 2019-04-17 ENCOUNTER — Emergency Department: Payer: No Typology Code available for payment source

## 2019-04-17 ENCOUNTER — Other Ambulatory Visit: Payer: Self-pay

## 2019-04-17 DIAGNOSIS — M109 Gout, unspecified: Secondary | ICD-10-CM | POA: Diagnosis present

## 2019-04-17 DIAGNOSIS — I130 Hypertensive heart and chronic kidney disease with heart failure and stage 1 through stage 4 chronic kidney disease, or unspecified chronic kidney disease: Secondary | ICD-10-CM | POA: Diagnosis present

## 2019-04-17 DIAGNOSIS — I2510 Atherosclerotic heart disease of native coronary artery without angina pectoris: Secondary | ICD-10-CM | POA: Diagnosis present

## 2019-04-17 DIAGNOSIS — I878 Other specified disorders of veins: Secondary | ICD-10-CM

## 2019-04-17 DIAGNOSIS — R531 Weakness: Secondary | ICD-10-CM

## 2019-04-17 DIAGNOSIS — D649 Anemia, unspecified: Secondary | ICD-10-CM | POA: Diagnosis present

## 2019-04-17 DIAGNOSIS — K219 Gastro-esophageal reflux disease without esophagitis: Secondary | ICD-10-CM | POA: Diagnosis present

## 2019-04-17 DIAGNOSIS — F17210 Nicotine dependence, cigarettes, uncomplicated: Secondary | ICD-10-CM | POA: Diagnosis present

## 2019-04-17 DIAGNOSIS — R5383 Other fatigue: Secondary | ICD-10-CM

## 2019-04-17 DIAGNOSIS — E860 Dehydration: Secondary | ICD-10-CM

## 2019-04-17 DIAGNOSIS — S0101XA Laceration without foreign body of scalp, initial encounter: Secondary | ICD-10-CM | POA: Diagnosis present

## 2019-04-17 DIAGNOSIS — N1832 Chronic kidney disease, stage 3b: Secondary | ICD-10-CM | POA: Diagnosis present

## 2019-04-17 DIAGNOSIS — W010XXA Fall on same level from slipping, tripping and stumbling without subsequent striking against object, initial encounter: Secondary | ICD-10-CM | POA: Diagnosis present

## 2019-04-17 DIAGNOSIS — Z515 Encounter for palliative care: Secondary | ICD-10-CM

## 2019-04-17 DIAGNOSIS — Z930 Tracheostomy status: Secondary | ICD-10-CM

## 2019-04-17 DIAGNOSIS — E1122 Type 2 diabetes mellitus with diabetic chronic kidney disease: Secondary | ICD-10-CM | POA: Diagnosis present

## 2019-04-17 DIAGNOSIS — Z7189 Other specified counseling: Secondary | ICD-10-CM

## 2019-04-17 DIAGNOSIS — G9341 Metabolic encephalopathy: Secondary | ICD-10-CM

## 2019-04-17 DIAGNOSIS — N179 Acute kidney failure, unspecified: Secondary | ICD-10-CM

## 2019-04-17 DIAGNOSIS — I5022 Chronic systolic (congestive) heart failure: Secondary | ICD-10-CM | POA: Diagnosis present

## 2019-04-17 DIAGNOSIS — Z7901 Long term (current) use of anticoagulants: Secondary | ICD-10-CM

## 2019-04-17 DIAGNOSIS — R296 Repeated falls: Secondary | ICD-10-CM | POA: Diagnosis present

## 2019-04-17 DIAGNOSIS — I83028 Varicose veins of left lower extremity with ulcer other part of lower leg: Secondary | ICD-10-CM | POA: Diagnosis present

## 2019-04-17 DIAGNOSIS — N170 Acute kidney failure with tubular necrosis: Secondary | ICD-10-CM | POA: Diagnosis present

## 2019-04-17 DIAGNOSIS — Y9301 Activity, walking, marching and hiking: Secondary | ICD-10-CM | POA: Diagnosis present

## 2019-04-17 DIAGNOSIS — Z20822 Contact with and (suspected) exposure to covid-19: Secondary | ICD-10-CM | POA: Diagnosis present

## 2019-04-17 DIAGNOSIS — Z66 Do not resuscitate: Secondary | ICD-10-CM | POA: Diagnosis present

## 2019-04-17 DIAGNOSIS — J449 Chronic obstructive pulmonary disease, unspecified: Secondary | ICD-10-CM | POA: Diagnosis present

## 2019-04-17 DIAGNOSIS — Z79899 Other long term (current) drug therapy: Secondary | ICD-10-CM

## 2019-04-17 DIAGNOSIS — Z950 Presence of cardiac pacemaker: Secondary | ICD-10-CM

## 2019-04-17 DIAGNOSIS — E785 Hyperlipidemia, unspecified: Secondary | ICD-10-CM | POA: Diagnosis present

## 2019-04-17 DIAGNOSIS — I480 Paroxysmal atrial fibrillation: Secondary | ICD-10-CM | POA: Diagnosis present

## 2019-04-17 HISTORY — DX: Tracheostomy status: Z93.0

## 2019-04-17 NOTE — ED Triage Notes (Addendum)
Pt to ED via EMS from [facility], pt arrives a&o x4, per ems facility states inc weakness over past few days. Pt arrives slightly lethargic, easily aroused with verbal stimuli. Pt has no complaints. VSS.

## 2019-04-17 NOTE — ED Provider Notes (Signed)
[facility] Emergency Department Provider Note  ____________________________________________   First MD Initiated Contact with Patient 04/17/19 2340     (approximate)  I have reviewed the triage vital signs and the nursing notes.   HISTORY  Chief Complaint Weakness    HPI Danny Glover is a 69 y.o. male with medical history history as listed below which notably includes dependence on tracheostomy and he lives [facility].  He presents tonight by EMS for evaluation of weakness.  Reportedly he has been much sleepier than usual today.  He denies any other symptoms but admits that he is extremely sleepy and feels like he cannot do anything for himself.  Typically he does his own tracheostomy care but feels unable to do so.  The EMS report was that he has been weak over the last few days but the patient says that it has been today.  No other additional history was provided.  The patient denies chest pain, shortness of breath, fever, abdominal pain, but says that the weakness and fatigue is severe.  Nothing in particular makes it better or worse.        Past Medical History:  Diagnosis Date  . CHF (congestive heart failure) (HCC)   . Diabetes mellitus without complication (HCC)   . GERD (gastroesophageal reflux disease)   . Gout   . Hyperlipemia   . Tracheostomy dependent [facility])     Patient Active Problem List   Diagnosis Date Noted  . Falls frequently 03/27/2019  . Paroxysmal atrial fibrillation (HCC) 01/03/2015  . Chronic obstructive pulmonary disease (HCC) 01/01/2015  . Chronic systolic (congestive) heart failure (HCC) 01/01/2015  . Controlled type 2 diabetes mellitus without complication (HCC) 01/01/2015  . Coronary artery disease involving native coronary artery 01/01/2015  . Essential hypertension 01/01/2015  . History of vocal cord paralysis 01/01/2015  . Hyperlipidemia 01/01/2015  . Tobacco abuse 01/01/2015  .  Tracheostomy dependent (HCC) 01/01/2015  . Biventricular ICD (implantable cardioverter-defibrillator) in place 09/01/2014    Past Surgical History:  Procedure Laterality Date  . PACEMAKER PLACEMENT    . TRACHEOSTOMY      Prior to Admission medications   Medication Sig Start Date End Date Taking? Authorizing Provider  acetaminophen (TYLENOL) 325 MG tablet Take 650 mg by mouth every 6 (six) hours as needed for mild pain, fever or headache.    Yes [provider]  allopurinol (ZYLOPRIM) 100 MG tablet Take 100 mg by mouth daily.   Yes [provider]  apixaban (ELIQUIS) 5 MG TABS tablet Take 5 mg by mouth 2 (two) times daily.    Yes [provider]  ascorbic acid (VITAMIN C) 500 MG tablet Take 500 mg by mouth 2 (two) times daily.   Yes [provider]  atorvastatin (LIPITOR) 40 MG tablet Take 40 mg by mouth at bedtime.    Yes [provider]  cholecalciferol (VITAMIN D) 25 MCG (1000 UNIT) tablet Take 5,000 Units by mouth daily.   Yes [provider]  dextromethorphan-guaiFENesin (MUCINEX DM) 30-600 MG 12hr tablet Take 1 tablet by mouth 2 (two) times daily.   Yes [provider]  docusate sodium (COLACE) 100 MG capsule Take 100 mg by mouth daily as needed for mild constipation.   Yes [provider]  ferrous sulfate 324 MG TBEC Take 324 mg by mouth daily.    Yes [provider]  gabapentin (NEURONTIN) 100 MG capsule Take 100 mg by mouth every 12 (twelve) hours.  Yes [provider]  lisinopril (ZESTRIL) 5 MG tablet Take 2.5 mg by mouth daily.   Yes [provider]  metolazone (ZAROXOLYN) 2.5 MG tablet Take 2.5 mg by mouth daily as needed (edema).    Yes [provider]  metoprolol succinate (TOPROL-XL) 100 MG 24 hr tablet Take 100 mg by mouth daily.    Yes [provider]  mirtazapine (REMERON) 7.5 MG tablet Take 7.5 mg by mouth at bedtime.   Yes [provider]    Multiple Vitamin (MULTIVITAMIN) tablet Take 1 tablet by mouth daily.   Yes [provider]  oxyCODONE (OXY IR/ROXICODONE) 5 MG immediate release tablet Take 5 mg by mouth every 6 (six) hours as needed for severe pain.   Yes [provider]  pantoprazole (PROTONIX) 40 MG tablet Take 40 mg by mouth daily.   Yes [provider]  torsemide (DEMADEX) 20 MG tablet Take 20 mg by mouth daily.    Yes [provider]  torsemide (DEMADEX) 20 MG tablet Take 40 mg by mouth daily as needed (edema).   Yes [provider]  Zinc Sulfate 220 (50 Zn) MG TABS Take 220 mg by mouth every evening.   Yes [provider]    Allergies Patient has no known allergies.  Family History  Problem Relation Age of Onset  . Hypertension Mother   . Heart attack Father     Social History Social History   Tobacco Use  . Smoking status: Current Every Day Smoker    Packs/day: 1.00    Types: Cigarettes  . Smokeless tobacco: Never Used  Substance Use Topics  . Alcohol use: Never  . Drug use: Never    Review of Systems Constitutional: Severe fatigue and weakness.  No fever/chills. Eyes: No visual changes. ENT: No sore throat. Cardiovascular: Denies chest pain. Respiratory: Denies shortness of breath. Gastrointestinal: No abdominal pain.  No nausea, no vomiting.  No diarrhea.  No constipation. Genitourinary: Negative for dysuria. Musculoskeletal: Negative for neck pain.  Negative for back pain. Integumentary: Negative for rash. Neurological: Negative for headaches, focal weakness or numbness.   ____________________________________________   PHYSICAL EXAM:  VITAL SIGNS: ED Triage Vitals  Enc Vitals Group     BP 04/17/19 2329 125/77     Pulse Rate 04/17/19 2329 70     Resp 04/17/19 2329 18     Temp 04/17/19 2329 98.6 F (37 C)     Temp Source 04/17/19 2329 Oral     SpO2 04/17/19 2329 98 %     Weight 04/17/19 2327 90.7 kg (199 lb 15.3 oz)     Height  04/17/19 2327 1.753 m (5\' 9" )     Head Circumference --      Peak Flow --      Pain Score 04/17/19 2327 0     Pain Loc --      Pain Edu? --      Excl. in [place]? --     Constitutional: Alert and oriented but appears chronically ill and and somnolent at this time.  Eyes: Conjunctivae are normal.  Head: Atraumatic. Nose: No congestion/rhinnorhea. Mouth/Throat: Patient is wearing a mask. Neck: Trach in place.  Thick breath sounds, some gargling.  No stridor.  No meningeal signs.  Patient is able to speak after using the valve on the trach. Cardiovascular: Normal rate, regular rhythm. Good peripheral circulation. Grossly normal heart sounds. Respiratory: Normal respiratory effort.  No retractions. Gastrointestinal: Soft and nontender. No distention.  Musculoskeletal: Chronic  lower extremity edema.  See skin exam below.  Neurologic:  Normal speech and language. No gross focal neurologic deficits are appreciated but the patient is extremely weak and having difficulty even moving his extremities. Skin: Chronic skin thickening and chronic venous stasis. Chronic wounds on both legs with granulation tissue.  No purulence.  No surrounding cellulitis.  Multiple areas of desquamation. Psychiatric: Mood and affect are normal. Speech and behavior are normal.  ____________________________________________   LABS (all labs ordered are listed, but only abnormal results are displayed)  Labs Reviewed  CBC WITH DIFFERENTIAL/PLATELET - Abnormal; Notable for the following components:      Result Value   RBC 3.82 (*)    Hemoglobin 9.2 (*)    HCT 32.9 (*)    MCH 24.1 (*)    MCHC 28.0 (*)    RDW 29.0 (*)    Platelets 149 (*)    nRBC 0.4 (*)    All other components within normal limits  URINALYSIS, COMPLETE (UACMP) WITH MICROSCOPIC - Abnormal; Notable for the following components:   Color, Urine YELLOW (*)    APPearance CLEAR (*)    Leukocytes,Ua TRACE (*)    All other components within normal limits    COMPREHENSIVE METABOLIC PANEL - Abnormal; Notable for the following components:   Sodium 134 (*)    Chloride 96 (*)    Glucose, Bld 106 (*)    BUN 102 (*)    Creatinine, Ser 2.15 (*)    Calcium 8.8 (*)    Albumin 3.0 (*)    Total Bilirubin 1.6 (*)    GFR calc non Af Amer 30 (*)    GFR calc Af Amer 35 (*)    All other components within normal limits  BLOOD GAS, VENOUS - Abnormal; Notable for the following components:   pO2, Ven <31.0 (*)    Bicarbonate 34.4 (*)    Acid-Base Excess 8.2 (*)    All other components within normal limits  URINE CULTURE  SARS CORONAVIRUS 2 (TAT 6-24 HRS)  LACTIC ACID, PLASMA  LIPASE, BLOOD  PROCALCITONIN  POC SARS CORONAVIRUS 2 AG -  ED  POC SARS CORONAVIRUS 2 AG   ____________________________________________  EKG  ED ECG REPORT I, Loleta Rose, the attending physician, personally viewed and interpreted this ECG.  Date: 04/17/2019 EKG Time: 23: 32 Rate: 70 Rhythm: Accelerated junctional rhythm QRS Axis: Left axis deviation Intervals: Nonspecific intraventricular conduction delay ST/T Wave abnormalities: Non-specific ST segment / T-wave changes, but no clear evidence of acute ischemia. Narrative Interpretation: no definitive evidence of acute ischemia; does not meet STEMI criteria.   ____________________________________________  RADIOLOGY I, Loleta Rose, personally viewed and evaluated these images (plain radiographs) as part of my medical decision making, as well as reviewing the written report by the radiologist.  ED MD interpretation: Atelectasis versus basilar infiltrates but consistent to prior chest x-ray.  Official radiology report(s): CT Head Wo Contrast  Result Date: 04/18/2019 CLINICAL DATA:  Altered mental status EXAM: CT HEAD WITHOUT CONTRAST TECHNIQUE: Contiguous axial images were obtained from the base of the skull through the vertex without intravenous contrast. COMPARISON:  CT head 04/15/2019 FINDINGS: Brain: Stable  focus of hypoattenuation in the right basal ganglia likely reflecting sequela of prior lacunar infarct. No evidence of acute infarction, hemorrhage, hydrocephalus, extra-axial collection or mass lesion/mass effect. Symmetric prominence of the ventricles, cisterns and sulci compatible with parenchymal volume loss. Patchy areas of white matter hypoattenuation are most compatible with chronic microvascular angiopathy. Vascular: Atherosclerotic calcification of the  carotid siphons and intradural vertebral arteries. No hyperdense vessel. Skull: Redemonstrated area of posterior left parieto-occipital scalp swelling and small crescentic hematoma measuring up to 4 mm in maximal thickness with overlying skin staples. No subjacent calvarial fracture. No suspicious osseous lesion. Sinuses/Orbits: Paranasal sinuses and mastoid air cells are predominantly clear. Included orbital structures are unremarkable. Other: None IMPRESSION: 1. No acute intracranial abnormality. 2. Stable chronic microvascular angiopathy and parenchymal volume loss. 3. Stable remote right basal ganglia lacunar infarct. 4. Decreased left parieto-occipital scalp swelling with overlying staples. No subjacent calvarial fracture. Electronically Signed   By: Kreg Shropshire M.D.   On: 04/18/2019 02:40   DG Chest Port 1 View  Result Date: 04/17/2019 CLINICAL DATA:  Lethargic, possible aspiration EXAM: PORTABLE CHEST 1 VIEW COMPARISON:  04/04/2019 FINDINGS: Prior CABG. Tracheostomy and left pacer unchanged. Cardiomegaly. Bilateral lower lobe airspace opacities and small effusions. No acute bony abnormality. IMPRESSION: Bilateral atelectasis or infiltrates in the lung bases, similar to prior study. Small bilateral effusions. Cardiomegaly. Electronically Signed   By: Charlett Nose M.D.   On: 04/17/2019 23:53    ____________________________________________   PROCEDURES   Procedure(s) performed (including Critical  Care):  Procedures   ____________________________________________   INITIAL IMPRESSION / MDM / ASSESSMENT AND PLAN / ED COURSE  As part of my medical decision making, I reviewed the following data within the electronic MEDICAL RECORD NUMBER Nursing notes reviewed and incorporated, Labs reviewed , EKG interpreted , Old chart reviewed, Radiograph reviewed , Discussed with admitting physician (Dr. Arville Care)  and Notes from prior ED visits   Differential diagnosis includes, but is not limited to, sepsis from any 1 of a number of sources including pneumonia and urinary tract, CVA, intracranial bleed, COVID-19, less likely ACS.  The patient is not able to provide much history but is apparently off of his baseline.  He is alert and oriented but somnolent.  I have called respiratory as per the patient's request to have them suctioned the patient's airway which she is usually able to do himself.  Septic work-up is pending but as of right now the patient's vital signs do not meet any SIRS criteria.  I have asked the staff to get a rectal temp when performing the in and out catheterization.      Clinical Course as of Apr 17 301  Wed Apr 17, 2019  2357 atelectasis vs infiltrate, but similar to prior study  [facility] Chest [place] [CF]  Thu Apr 18, 2019  0007 Lactic Acid, Venous: 1.4 [CF]  0019 Somewhat puzzling, the rectal temperature read as 96.7 degrees while the oral temperature was 98.6 degrees.  However the nurse reported that the thermometer seem to be working very slowly and this could possibly represent an equipment malfunction.  The patient has no tachypnea, no leukocytosis, and no tachycardia, and does not meet SIRS criteria at this time.  Temp(!): 96.7 F (35.9 C) [CF]  0039 No clear indication of inection  Urinalysis, Complete w Microscopic(!) [CF]  0131 Creatinine is elevated and the BUN is substantially elevated at 102.  This likely represents volume depletion/dehydration/acute kidney injury.   Given his history of COPD also going to obtain a VBG to see if he is hypoventilating leading to hypercarbia.  Covid testing is pending.  Comprehensive metabolic panel(!) [CF]  0211 Reassuring pCO2, no evidence of hypercapnea  pCO2, Ven: 53 [CF]  0242 No acute abnormalities.  Will consult hospitalist for admission.  CT Head Wo Contrast [CF]  0300 Discussed case  in person with Dr. Arville Care who will admit.   [CF]    Clinical Course User Index [CF] Loleta Rose, MD     ____________________________________________  FINAL CLINICAL IMPRESSION(S) / ED DIAGNOSES  Final diagnoses:  Acute renal failure, unspecified acute renal failure type (HCC)  Lethargy  Generalized weakness  Chronic venous stasis  Dehydration  Tracheostomy dependent (HCC)     MEDICATIONS GIVEN DURING THIS VISIT:  Medications  sodium chloride 0.9 % bolus 500 mL (500 mLs Intravenous New Bag/Given 04/18/19 0146)     ED Discharge Orders    None      *Please note:  Benyamin Lewallen was evaluated in Emergency Department on 04/18/2019 for the symptoms described in the history of present illness. He was evaluated in the context of the global COVID-19 pandemic, which necessitated consideration that the patient might be at risk for infection with the SARS-CoV-2 virus that causes COVID-19. Institutional protocols and algorithms that pertain to the evaluation of patients at risk for COVID-19 are in a state of rapid change based on information released by regulatory bodies including the CDC and federal and state organizations. These policies and algorithms were followed during the patient's care in the ED.  Some ED evaluations and interventions may be delayed as a result of limited staffing during the pandemic.*  Note:  This document was prepared using Dragon voice recognition software and may include unintentional dictation errors.   Loleta Rose, MD 04/18/19 [phone]

## 2019-04-18 ENCOUNTER — Other Ambulatory Visit: Payer: Self-pay

## 2019-04-18 ENCOUNTER — Emergency Department: Payer: No Typology Code available for payment source

## 2019-04-18 ENCOUNTER — Encounter: Payer: Self-pay | Admitting: Emergency Medicine

## 2019-04-18 DIAGNOSIS — N189 Chronic kidney disease, unspecified: Secondary | ICD-10-CM

## 2019-04-18 DIAGNOSIS — G9341 Metabolic encephalopathy: Secondary | ICD-10-CM | POA: Diagnosis present

## 2019-04-18 DIAGNOSIS — I480 Paroxysmal atrial fibrillation: Secondary | ICD-10-CM | POA: Diagnosis present

## 2019-04-18 DIAGNOSIS — Z930 Tracheostomy status: Secondary | ICD-10-CM | POA: Diagnosis not present

## 2019-04-18 DIAGNOSIS — F17210 Nicotine dependence, cigarettes, uncomplicated: Secondary | ICD-10-CM | POA: Diagnosis present

## 2019-04-18 DIAGNOSIS — Z20822 Contact with and (suspected) exposure to covid-19: Secondary | ICD-10-CM | POA: Diagnosis present

## 2019-04-18 DIAGNOSIS — Z515 Encounter for palliative care: Secondary | ICD-10-CM

## 2019-04-18 DIAGNOSIS — Z7189 Other specified counseling: Secondary | ICD-10-CM

## 2019-04-18 DIAGNOSIS — N170 Acute kidney failure with tubular necrosis: Secondary | ICD-10-CM | POA: Diagnosis present

## 2019-04-18 DIAGNOSIS — K219 Gastro-esophageal reflux disease without esophagitis: Secondary | ICD-10-CM | POA: Diagnosis present

## 2019-04-18 DIAGNOSIS — E1122 Type 2 diabetes mellitus with diabetic chronic kidney disease: Secondary | ICD-10-CM | POA: Diagnosis present

## 2019-04-18 DIAGNOSIS — N179 Acute kidney failure, unspecified: Secondary | ICD-10-CM

## 2019-04-18 DIAGNOSIS — I130 Hypertensive heart and chronic kidney disease with heart failure and stage 1 through stage 4 chronic kidney disease, or unspecified chronic kidney disease: Secondary | ICD-10-CM | POA: Diagnosis present

## 2019-04-18 DIAGNOSIS — N1832 Chronic kidney disease, stage 3b: Secondary | ICD-10-CM | POA: Diagnosis present

## 2019-04-18 DIAGNOSIS — I878 Other specified disorders of veins: Secondary | ICD-10-CM

## 2019-04-18 DIAGNOSIS — R531 Weakness: Secondary | ICD-10-CM | POA: Diagnosis present

## 2019-04-18 DIAGNOSIS — E785 Hyperlipidemia, unspecified: Secondary | ICD-10-CM | POA: Diagnosis present

## 2019-04-18 DIAGNOSIS — D649 Anemia, unspecified: Secondary | ICD-10-CM | POA: Diagnosis present

## 2019-04-18 DIAGNOSIS — L97929 Non-pressure chronic ulcer of unspecified part of left lower leg with unspecified severity: Secondary | ICD-10-CM

## 2019-04-18 DIAGNOSIS — I2510 Atherosclerotic heart disease of native coronary artery without angina pectoris: Secondary | ICD-10-CM | POA: Diagnosis present

## 2019-04-18 DIAGNOSIS — W010XXA Fall on same level from slipping, tripping and stumbling without subsequent striking against object, initial encounter: Secondary | ICD-10-CM | POA: Diagnosis present

## 2019-04-18 DIAGNOSIS — I5022 Chronic systolic (congestive) heart failure: Secondary | ICD-10-CM | POA: Diagnosis present

## 2019-04-18 DIAGNOSIS — Z950 Presence of cardiac pacemaker: Secondary | ICD-10-CM | POA: Diagnosis not present

## 2019-04-18 DIAGNOSIS — I83029 Varicose veins of left lower extremity with ulcer of unspecified site: Secondary | ICD-10-CM | POA: Diagnosis not present

## 2019-04-18 DIAGNOSIS — Z79899 Other long term (current) drug therapy: Secondary | ICD-10-CM | POA: Diagnosis not present

## 2019-04-18 DIAGNOSIS — S0101XA Laceration without foreign body of scalp, initial encounter: Secondary | ICD-10-CM | POA: Diagnosis present

## 2019-04-18 DIAGNOSIS — Z66 Do not resuscitate: Secondary | ICD-10-CM | POA: Diagnosis present

## 2019-04-18 DIAGNOSIS — I83892 Varicose veins of left lower extremities with other complications: Secondary | ICD-10-CM

## 2019-04-18 DIAGNOSIS — E860 Dehydration: Secondary | ICD-10-CM | POA: Diagnosis present

## 2019-04-18 DIAGNOSIS — Z7901 Long term (current) use of anticoagulants: Secondary | ICD-10-CM | POA: Diagnosis not present

## 2019-04-18 DIAGNOSIS — R609 Edema, unspecified: Secondary | ICD-10-CM

## 2019-04-18 DIAGNOSIS — Y9301 Activity, walking, marching and hiking: Secondary | ICD-10-CM | POA: Diagnosis present

## 2019-04-18 DIAGNOSIS — M109 Gout, unspecified: Secondary | ICD-10-CM | POA: Diagnosis present

## 2019-04-18 DIAGNOSIS — J449 Chronic obstructive pulmonary disease, unspecified: Secondary | ICD-10-CM | POA: Diagnosis present

## 2019-04-18 DIAGNOSIS — I83028 Varicose veins of left lower extremity with ulcer other part of lower leg: Secondary | ICD-10-CM | POA: Diagnosis present

## 2019-04-18 DIAGNOSIS — R296 Repeated falls: Secondary | ICD-10-CM | POA: Diagnosis present

## 2019-04-18 LAB — CBC WITH DIFFERENTIAL/PLATELET
Abs Immature Granulocytes: 0.02 10*3/uL (ref 0.00–0.07)
Basophils Absolute: 0.1 10*3/uL (ref 0.0–0.1)
Basophils Relative: 1 %
Eosinophils Absolute: 0.1 10*3/uL (ref 0.0–0.5)
Eosinophils Relative: 3 %
HCT: 32.9 % — ABNORMAL LOW (ref 39.0–52.0)
Hemoglobin: 9.2 g/dL — ABNORMAL LOW (ref 13.0–17.0)
Immature Granulocytes: 0 %
Lymphocytes Relative: 15 %
Lymphs Abs: 0.8 10*3/uL (ref 0.7–4.0)
MCH: 24.1 pg — ABNORMAL LOW (ref 26.0–34.0)
MCHC: 28 g/dL — ABNORMAL LOW (ref 30.0–36.0)
MCV: 86.1 fL (ref 80.0–100.0)
Monocytes Absolute: 0.8 10*3/uL (ref 0.1–1.0)
Monocytes Relative: 14 %
Neutro Abs: 3.8 10*3/uL (ref 1.7–7.7)
Neutrophils Relative %: 67 %
Platelets: 149 10*3/uL — ABNORMAL LOW (ref 150–400)
RBC: 3.82 MIL/uL — ABNORMAL LOW (ref 4.22–5.81)
RDW: 29 % — ABNORMAL HIGH (ref 11.5–15.5)
WBC: 5.6 10*3/uL (ref 4.0–10.5)
nRBC: 0.4 % — ABNORMAL HIGH (ref 0.0–0.2)

## 2019-04-18 LAB — BASIC METABOLIC PANEL
Anion gap: 13 (ref 5–15)
BUN: 99 mg/dL — ABNORMAL HIGH (ref 8–23)
CO2: 25 mmol/L (ref 22–32)
Calcium: 8.7 mg/dL — ABNORMAL LOW (ref 8.9–10.3)
Chloride: 98 mmol/L (ref 98–111)
Creatinine, Ser: 1.84 mg/dL — ABNORMAL HIGH (ref 0.61–1.24)
GFR calc Af Amer: 42 mL/min — ABNORMAL LOW (ref >60)
GFR calc non Af Amer: 36 mL/min — ABNORMAL LOW (ref >60)
Glucose, Bld: 103 mg/dL — ABNORMAL HIGH (ref 70–99)
Potassium: 4.7 mmol/L (ref 3.5–5.1)
Sodium: 136 mmol/L (ref 135–145)

## 2019-04-18 LAB — COMPREHENSIVE METABOLIC PANEL
ALT: 15 U/L (ref 0–44)
AST: 21 U/L (ref 15–41)
Albumin: 3 g/dL — ABNORMAL LOW (ref 3.5–5.0)
Alkaline Phosphatase: 95 U/L (ref 38–126)
Anion gap: 11 (ref 5–15)
BUN: 102 mg/dL — ABNORMAL HIGH (ref 8–23)
CO2: 27 mmol/L (ref 22–32)
Calcium: 8.8 mg/dL — ABNORMAL LOW (ref 8.9–10.3)
Chloride: 96 mmol/L — ABNORMAL LOW (ref 98–111)
Creatinine, Ser: 2.15 mg/dL — ABNORMAL HIGH (ref 0.61–1.24)
GFR calc Af Amer: 35 mL/min — ABNORMAL LOW (ref >60)
GFR calc non Af Amer: 30 mL/min — ABNORMAL LOW (ref >60)
Glucose, Bld: 106 mg/dL — ABNORMAL HIGH (ref 70–99)
Potassium: 4.9 mmol/L (ref 3.5–5.1)
Sodium: 134 mmol/L — ABNORMAL LOW (ref 135–145)
Total Bilirubin: 1.6 mg/dL — ABNORMAL HIGH (ref 0.3–1.2)
Total Protein: 7.8 g/dL (ref 6.5–8.1)

## 2019-04-18 LAB — URINALYSIS, COMPLETE (UACMP) WITH MICROSCOPIC
Bacteria, UA: NONE SEEN
Bilirubin Urine: NEGATIVE
Glucose, UA: NEGATIVE mg/dL
Hgb urine dipstick: NEGATIVE
Ketones, ur: NEGATIVE mg/dL
Nitrite: NEGATIVE
Protein, ur: NEGATIVE mg/dL
Specific Gravity, Urine: 1.013 (ref 1.005–1.030)
Squamous Epithelial / HPF: NONE SEEN (ref 0–5)
pH: 5 (ref 5.0–8.0)

## 2019-04-18 LAB — BLOOD GAS, ARTERIAL
Acid-Base Excess: 4 mmol/L — ABNORMAL HIGH (ref 0.0–2.0)
Bicarbonate: 27.6 mmol/L (ref 20.0–28.0)
FIO2: 0.21
O2 Saturation: 95.9 %
Patient temperature: 37
pCO2 arterial: 37 mmHg (ref 32.0–48.0)
pH, Arterial: 7.48 — ABNORMAL HIGH (ref 7.350–7.450)
pO2, Arterial: 75 mmHg — ABNORMAL LOW (ref 83.0–108.0)

## 2019-04-18 LAB — POC SARS CORONAVIRUS 2 AG: SARS Coronavirus 2 Ag: NEGATIVE

## 2019-04-18 LAB — BLOOD GAS, VENOUS
Acid-Base Excess: 8.2 mmol/L — ABNORMAL HIGH (ref 0.0–2.0)
Bicarbonate: 34.4 mmol/L — ABNORMAL HIGH (ref 20.0–28.0)
FIO2: 0.28
O2 Saturation: 44.2 %
Patient temperature: 37
pCO2, Ven: 53 mmHg (ref 44.0–60.0)
pH, Ven: 7.42 (ref 7.250–7.430)
pO2, Ven: <31 mmHg — CL (ref 32.0–45.0)

## 2019-04-18 LAB — LACTIC ACID, PLASMA: Lactic Acid, Venous: 1.4 mmol/L (ref 0.5–1.9)

## 2019-04-18 LAB — CBC
HCT: 31.6 % — ABNORMAL LOW (ref 39.0–52.0)
Hemoglobin: 9.2 g/dL — ABNORMAL LOW (ref 13.0–17.0)
MCH: 24.5 pg — ABNORMAL LOW (ref 26.0–34.0)
MCHC: 29.1 g/dL — ABNORMAL LOW (ref 30.0–36.0)
MCV: 84.3 fL (ref 80.0–100.0)
Platelets: 159 10*3/uL (ref 150–400)
RBC: 3.75 MIL/uL — ABNORMAL LOW (ref 4.22–5.81)
RDW: 29.3 % — ABNORMAL HIGH (ref 11.5–15.5)
WBC: 7.7 10*3/uL (ref 4.0–10.5)
nRBC: 0 % (ref 0.0–0.2)

## 2019-04-18 LAB — MAGNESIUM: Magnesium: 1.7 mg/dL (ref 1.7–2.4)

## 2019-04-18 LAB — SARS CORONAVIRUS 2 (TAT 6-24 HRS): SARS Coronavirus 2: NEGATIVE

## 2019-04-18 LAB — LIPASE, BLOOD: Lipase: 27 U/L (ref 11–51)

## 2019-04-18 LAB — PROCALCITONIN: Procalcitonin: 0.22 ng/mL

## 2019-04-18 MED ORDER — VITAMIN D 25 MCG (1000 UNIT) PO TABS
5000.0000 [IU] | ORAL_TABLET | Freq: Every day | ORAL | Status: DC
  Administered 2019-04-18 – 2019-04-20 (×2): 5000 [IU] via ORAL
  Filled 2019-04-18 (×2): qty 5

## 2019-04-18 MED ORDER — ADULT MULTIVITAMIN W/MINERALS CH
1.0000 | ORAL_TABLET | Freq: Every day | ORAL | Status: DC
  Administered 2019-04-18 – 2019-04-20 (×2): 1 via ORAL
  Filled 2019-04-18 (×2): qty 1

## 2019-04-18 MED ORDER — DM-GUAIFENESIN ER 30-600 MG PO TB12
1.0000 | ORAL_TABLET | Freq: Two times a day (BID) | ORAL | Status: DC
  Administered 2019-04-18 (×2): 1 via ORAL
  Filled 2019-04-18 (×4): qty 1

## 2019-04-18 MED ORDER — ACETAMINOPHEN 325 MG PO TABS: 650.0000 mg | ORAL_TABLET | Freq: Four times a day (QID) | ORAL | Status: DC | PRN

## 2019-04-18 MED ORDER — SODIUM CHLORIDE 0.9 % IV BOLUS
500.0000 mL | Freq: Once | INTRAVENOUS | Status: AC
  Administered 2019-04-18: 500 mL via INTRAVENOUS

## 2019-04-18 MED ORDER — TRAZODONE HCL 50 MG PO TABS: 25.0000 mg | ORAL_TABLET | Freq: Every evening | ORAL | Status: DC | PRN

## 2019-04-18 MED ORDER — MIRTAZAPINE 15 MG PO TABS
7.5000 mg | ORAL_TABLET | Freq: Every day | ORAL | Status: DC
  Administered 2019-04-18: 7.5 mg via ORAL
  Filled 2019-04-18: qty 1

## 2019-04-18 MED ORDER — MAGNESIUM HYDROXIDE 400 MG/5ML PO SUSP: 30.0000 mL | Freq: Every day | ORAL | Status: DC | PRN

## 2019-04-18 MED ORDER — FERROUS SULFATE 325 (65 FE) MG PO TABS
324.0000 mg | ORAL_TABLET | Freq: Every day | ORAL | Status: DC
  Filled 2019-04-18 (×2): qty 1

## 2019-04-18 MED ORDER — FERROUS SULFATE 325 (65 FE) MG PO TABS
325.0000 mg | ORAL_TABLET | Freq: Every day | ORAL | Status: DC
  Administered 2019-04-18 – 2019-04-20 (×2): 325 mg via ORAL
  Filled 2019-04-18: qty 1

## 2019-04-18 MED ORDER — ALLOPURINOL 100 MG PO TABS
100.0000 mg | ORAL_TABLET | Freq: Every day | ORAL | Status: DC
  Administered 2019-04-18 – 2019-04-20 (×2): 100 mg via ORAL
  Filled 2019-04-18 (×3): qty 1

## 2019-04-18 MED ORDER — ACETAMINOPHEN 650 MG RE SUPP: 650.0000 mg | Freq: Four times a day (QID) | RECTAL | Status: DC | PRN

## 2019-04-18 MED ORDER — PANTOPRAZOLE SODIUM 40 MG PO TBEC
40.0000 mg | DELAYED_RELEASE_TABLET | Freq: Every day | ORAL | Status: DC
  Administered 2019-04-18 – 2019-04-20 (×2): 40 mg via ORAL
  Filled 2019-04-18 (×2): qty 1

## 2019-04-18 MED ORDER — ONDANSETRON HCL 4 MG PO TABS: 4.0000 mg | ORAL_TABLET | Freq: Four times a day (QID) | ORAL | Status: DC | PRN

## 2019-04-18 MED ORDER — ZINC SULFATE 220 (50 ZN) MG PO CAPS
220.0000 mg | ORAL_CAPSULE | Freq: Every evening | ORAL | Status: DC
  Administered 2019-04-18: 220 mg via ORAL
  Filled 2019-04-18 (×3): qty 1

## 2019-04-18 MED ORDER — METOPROLOL SUCCINATE ER 100 MG PO TB24
100.0000 mg | ORAL_TABLET | Freq: Every day | ORAL | Status: DC
  Administered 2019-04-18: 100 mg via ORAL
  Filled 2019-04-18: qty 1

## 2019-04-18 MED ORDER — DOCUSATE SODIUM 100 MG PO CAPS: 100.0000 mg | ORAL_CAPSULE | Freq: Every day | ORAL | Status: DC | PRN

## 2019-04-18 MED ORDER — OXYCODONE HCL 5 MG PO TABS
5.0000 mg | ORAL_TABLET | Freq: Four times a day (QID) | ORAL | Status: DC | PRN
  Administered 2019-04-18: 5 mg via ORAL
  Filled 2019-04-18: qty 1

## 2019-04-18 MED ORDER — GABAPENTIN 100 MG PO CAPS: 100.0000 mg | ORAL_CAPSULE | Freq: Two times a day (BID) | ORAL | Status: DC

## 2019-04-18 MED ORDER — ATORVASTATIN CALCIUM 20 MG PO TABS
40.0000 mg | ORAL_TABLET | Freq: Every day | ORAL | Status: DC
  Administered 2019-04-18: 40 mg via ORAL
  Filled 2019-04-18: qty 2

## 2019-04-18 MED ORDER — CHLORHEXIDINE GLUCONATE 0.12 % MT SOLN
15.0000 mL | Freq: Two times a day (BID) | OROMUCOSAL | Status: DC
  Administered 2019-04-18 – 2019-04-20 (×3): 15 mL via OROMUCOSAL
  Filled 2019-04-18 (×3): qty 15

## 2019-04-18 MED ORDER — ASCORBIC ACID 500 MG PO TABS
500.0000 mg | ORAL_TABLET | Freq: Every day | ORAL | Status: DC
  Administered 2019-04-18 – 2019-04-20 (×2): 500 mg via ORAL
  Filled 2019-04-18 (×2): qty 1

## 2019-04-18 MED ORDER — APIXABAN 5 MG PO TABS
5.0000 mg | ORAL_TABLET | Freq: Two times a day (BID) | ORAL | Status: DC
  Administered 2019-04-18 – 2019-04-20 (×3): 5 mg via ORAL
  Filled 2019-04-18 (×3): qty 1

## 2019-04-18 MED ORDER — ONDANSETRON HCL 4 MG/2ML IJ SOLN: 4.0000 mg | Freq: Four times a day (QID) | INTRAMUSCULAR | Status: DC | PRN

## 2019-04-18 MED ORDER — SODIUM CHLORIDE 0.9 % IV SOLN: INTRAVENOUS | Status: DC

## 2019-04-18 NOTE — ED Notes (Addendum)
RN to bedside for rounding. Patient out of bed in chair. Patient had removed IV and all cardiac leads. RN asked patient why he had removed IV and leads and patient stated "I don't know." Patient further stated: "I wanted to sit". Patient indicated everything became disconnected while attempting to get into chair. Patient placed back in bed. Patient reattached to cardiac monitor.  Patient placed on bed alarm. Patient given call bell and instructed to call RN and to not get up. Patient verbalized understanding. New PIV started and covered in [place].     Patient's inner cannula from Tracheostomy also missing; found on floor. ED MD to bedside to inspect tracheostomy site; tracheostomy site intact. RT paged. RT brought a new inner cannula, suctioned patient, and replaced dressing.      Patient resting comfortably in bed.

## 2019-04-18 NOTE — ED Notes (Signed)
This RN spoke with ED MD about patient's leg dressings. Per MD, bilateral leg dressing's replaced. Petroleum gauze applied directly on wound, covered with a 4 X 4, wrapped with coban and ace bandage.

## 2019-04-18 NOTE — Discharge Summary (Signed)
[facility] [place] [facility]   PATIENT NAME: Danny Glover    MR#:  [id number]  DATE OF BIRTH:  07/12/1950  DATE OF ADMISSION:  04/17/2019 ADMITTING PHYSICIAN: Hannah Beat, MD  DATE OF DISCHARGE: 04/18/2019  PRIMARY CARE PHYSICIAN: System, Pcp Not In    ADMISSION DIAGNOSIS:  Dehydration [E86.0] Lethargy [R53.83] Weakness [R53.1] Tracheostomy dependent (HCC) [Z93.0] Generalized weakness [R53.1] Acute renal failure, unspecified acute renal failure type (HCC) [N17.9] Chronic venous stasis [I87.8] Acute kidney injury (AKI) with acute tubular necrosis (ATN) (HCC) [N17.0]  DISCHARGE DIAGNOSIS:  Acute encephalopathy/Weakness/Lethargy--metabolic/Uremic Acute on Chronic Renal failure suspect pre-renal Chronic bilateral LE venous ulcers with chronic leg edema Recurrent Falls with recent left Parieto-Occipital hematoma  SECONDARY DIAGNOSIS:   Past Medical History:  Diagnosis Date  . CHF (congestive heart failure) (HCC)   . Diabetes mellitus without complication (HCC)   . GERD (gastroesophageal reflux disease)   . Gout   . Hyperlipemia   . Tracheostomy dependent [facility])     HOSPITAL COURSE:  Vimal Bommarito  is a 69 y.o. lethargic Caucasian male with a known history of type 2 diabetes mellitus, dyslipidemia, CHF, status post tracheostomy and chronic lower extremity venous stasis with left leg venous stasis chronic ulcer, who presented to the emergency room with acute onset of generalized weakness for the last week which has been getting worse to the point that he was unable to suction his tracheostomy  1.  Acute metabolic Encephalopathy with weakness and some lethargy/Generalized weakness - This could be related to acute kidney injury superimposed on stage IIIb chronic kidney disease with high BUN causing uremia -Nephrology consult requested. recommends 24 hour urine protein. -IV fluids -will follow his BMP.  - Physical therapy consult will be obtained.  -   COVID-19 PCR negative -pt's mentation much improved today -creat down to 1.84 (2.17) -BUN 102--99 ?poor muscle mass  2. Chronic bilateral lower extremity venous stasis with left leg venous stasis ulcer. -  Wound care consult to be obtained.  There is no clear evidence for infection and his procalcitonin was 0.22 with no leukocytosis or clear signs of cellulitis. -continue wound care per recs  3.  Chronic systolic CHF.  No evidence for current exacerbation.  - We are holding off diuretic therapy while cautiously hydrating him and following BMP.  4.  Paroxysmal atrial fibrillation.   -He is currently in paced rhythm.  Eliquis will be continued. . 5.  COPD.  - He has no current exacerbation.  Status post tracheostomy.  Suction as needed -Speech therapy to follow swallow evaulation  6.  Hypertension. -  We will continue Toprol-XL and hold off lisinopril given acute kidney injury.  7.  Gout.  Allopurinol will be resumed.  8.  GERD.  PPI therapy will be resumed.  9.  Dyslipidemia.  Statin therapy will be resumed.  10.  DVT prophylaxis.  We will continue Eliquis  11. Recent left parietal-occipital small hematoma  S/p suture on 04/15/2019--repeat CT head 04/17/19--shows decreasing hematoma  Pt has been accept at [facility] for transfer by Dr Lucila Maine Dter is aware--CM called her  CONSULTS OBTAINED:    DRUG ALLERGIES:  No Known Allergies  DISCHARGE MEDICATIONS:   Allergies as of 04/18/2019   No Known Allergies     Medication List    STOP taking these medications   lisinopril 5 MG tablet Commonly known as: ZESTRIL   metolazone 2.5 MG tablet Commonly known as: ZAROXOLYN   torsemide 20 MG  tablet Commonly known as: DEMADEX     TAKE these medications   acetaminophen 325 MG tablet Commonly known as: TYLENOL Take 650 mg by mouth every 6 (six) hours as needed for mild pain, fever or headache.   allopurinol 100 MG tablet Commonly known as: ZYLOPRIM Take 100 mg  by mouth daily.   apixaban 5 MG Tabs tablet Commonly known as: ELIQUIS Take 5 mg by mouth 2 (two) times daily.   ascorbic acid 500 MG tablet Commonly known as: VITAMIN C Take 500 mg by mouth 2 (two) times daily.   atorvastatin 40 MG tablet Commonly known as: LIPITOR Take 40 mg by mouth at bedtime.   cholecalciferol 25 MCG (1000 UNIT) tablet Commonly known as: VITAMIN D Take 5,000 Units by mouth daily.   dextromethorphan-guaiFENesin 30-600 MG 12hr tablet Commonly known as: MUCINEX DM Take 1 tablet by mouth 2 (two) times daily.   docusate sodium 100 MG capsule Commonly known as: COLACE Take 100 mg by mouth daily as needed for mild constipation.   ferrous sulfate 324 MG Tbec Take 324 mg by mouth daily.   gabapentin 100 MG capsule Commonly known as: NEURONTIN Take 100 mg by mouth every 12 (twelve) hours.   metoprolol succinate 100 MG 24 hr tablet Commonly known as: TOPROL-XL Take 100 mg by mouth daily.   mirtazapine 7.5 MG tablet Commonly known as: REMERON Take 7.5 mg by mouth at bedtime.   multivitamin tablet Take 1 tablet by mouth daily.   oxyCODONE 5 MG immediate release tablet Commonly known as: Oxy IR/ROXICODONE Take 5 mg by mouth every 6 (six) hours as needed for severe pain.   pantoprazole 40 MG tablet Commonly known as: PROTONIX Take 40 mg by mouth daily.   Zinc Sulfate 220 (50 Zn) MG Tabs Take 220 mg by mouth every evening.       If you experience worsening of your admission symptoms, develop shortness of breath, life threatening emergency, suicidal or homicidal thoughts you must seek medical attention immediately by calling 911 or calling your MD immediately  if symptoms less severe.  You Must read complete instructions/literature along with all the possible adverse reactions/side effects for all the Medicines you take and that have been prescribed to you. Take any new Medicines after you have completely understood and accept all the possible adverse  reactions/side effects.   Please note  You were cared for by a hospitalist during your hospital stay. If you have any questions about your discharge medications or the care you received while you were in the hospital after you are discharged, you can call the unit and asked to speak with the hospitalist on call if the hospitalist that took care of you is not available. Once you are discharged, your primary care physician will handle any further medical issues. Please note that NO REFILLS for any discharge medications will be authorized once you are discharged, as it is imperative that you return to your primary care physician (or establish a relationship with a primary care physician if you do not have one) for your aftercare needs so that they can reassess your need for medications and monitor your lab values. Today   SUBJECTIVE   More awake. Alert and oriented x2 No resp distress. C/po pain over the legs  VITAL SIGNS:  Blood pressure 133/69, pulse 70, temperature 97.6 F (36.4 C), temperature source Oral, resp. rate (!) 24, height 5\' 9"  (1.753 m), weight 90.7 kg, SpO2 98 %.  I/O:    Intake/Output  Summary (Last 24 hours) at 04/18/2019 1448 Last data filed at 04/18/2019 1000 Gross per 24 hour  Intake --  Output 775 ml  Net -775 ml    PHYSICAL EXAMINATION:  GENERAL:  69 y.o.-year-old patient lying in the bed with no acute distress.  Chronically ill EYES: Pupils equal, round, reactive to light and accommodation. No scleral icterus.  HEENT: Head atraumatic, normocephalic. Oropharynx and nasopharynx clear.  NECK:  Supple, no jugular venous distention. No thyroid enlargement, no tenderness. Trach+ LUNGS: Normal breath sounds bilaterally, no wheezing, rales,rhonchi or crepitation. No use of accessory muscles of respiration.  CARDIOVASCULAR: S1, S2 normal. No murmurs, rubs, or gallops.  ABDOMEN: Soft, non-tender, non-distended. Bowel sounds present. No organomegaly or mass.  EXTREMITIES:       NEUROLOGIC: Cranial nerves II through XII are intact. Muscle strength 5/5 in all extremities. Sensation intact. Gait not checked.  PSYCHIATRIC: The patient is alert and oriented x 3.  SKIN: No obvious rash, lesion, or ulcer.   DATA REVIEW:   CBC  Recent Labs  Lab 04/18/19 0625  WBC 7.7  HGB 9.2*  HCT 31.6*  PLT 159    Chemistries  Recent Labs  Lab 04/17/19 2337 04/17/19 2337 04/18/19 0703  NA 134*   < > 136  K 4.9   < > 4.7  CL 96*   < > 98  CO2 27   < > 25  GLUCOSE 106*   < > 103*  BUN 102*   < > 99*  CREATININE 2.15*   < > 1.84*  CALCIUM 8.8*   < > 8.7*  MG  --   --  1.7  AST 21  --   --   ALT 15  --   --   ALKPHOS 95  --   --   BILITOT 1.6*  --   --    < > = values in this interval not displayed.    Microbiology Results   Recent Results (from the past 240 hour(s))  SARS CORONAVIRUS 2 (TAT 6-24 HRS) Nasopharyngeal Nasopharyngeal Swab     Status: None   Collection Time: 04/18/19  1:40 AM   Specimen: Nasopharyngeal Swab  Result Value Ref Range Status   SARS Coronavirus 2 NEGATIVE NEGATIVE Final    Comment: (NOTE) SARS-CoV-2 target nucleic acids are NOT DETECTED. The SARS-CoV-2 RNA is generally detectable in upper and lower respiratory specimens during the acute phase of infection. Negative results do not preclude SARS-CoV-2 infection, do not rule out co-infections with other pathogens, and should not be used as the sole basis for treatment or other patient management decisions. Negative results must be combined with clinical observations, patient history, and epidemiological information. The expected result is Negative. Fact Sheet for Patients: HairSlick.no Fact Sheet for Healthcare Providers: quierodirigir.com This test is not yet approved or cleared by the [place] FDA and  has been authorized for detection and/or diagnosis of SARS-CoV-2 by FDA under an Emergency Use Authorization  (EUA). This EUA will remain  in effect (meaning this test can be used) for the duration of the COVID-19 declaration under Section 56 4(b)(1) of the Act, 21 U.S.C. section 360bbb-3(b)(1), unless the authorization is terminated or revoked sooner. Performed at [facility], [address], [place], [place] [address]     RADIOLOGY:  CT Head Wo Contrast  Result Date: 04/18/2019 CLINICAL DATA:  Altered mental status EXAM: CT HEAD WITHOUT CONTRAST TECHNIQUE: Contiguous axial images were obtained from the base of the skull through  the vertex without intravenous contrast. COMPARISON:  CT head 04/15/2019 FINDINGS: Brain: Stable focus of hypoattenuation in the right basal ganglia likely reflecting sequela of prior lacunar infarct. No evidence of acute infarction, hemorrhage, hydrocephalus, extra-axial collection or mass lesion/mass effect. Symmetric prominence of the ventricles, cisterns and sulci compatible with parenchymal volume loss. Patchy areas of white matter hypoattenuation are most compatible with chronic microvascular angiopathy. Vascular: Atherosclerotic calcification of the carotid siphons and intradural vertebral arteries. No hyperdense vessel. Skull: Redemonstrated area of posterior left parieto-occipital scalp swelling and small crescentic hematoma measuring up to 4 mm in maximal thickness with overlying skin staples. No subjacent calvarial fracture. No suspicious osseous lesion. Sinuses/Orbits: Paranasal sinuses and mastoid air cells are predominantly clear. Included orbital structures are unremarkable. Other: None IMPRESSION: 1. No acute intracranial abnormality. 2. Stable chronic microvascular angiopathy and parenchymal volume loss. 3. Stable remote right basal ganglia lacunar infarct. 4. Decreased left parieto-occipital scalp swelling with overlying staples. No subjacent calvarial fracture. Electronically Signed   By: Lovena Le M.D.   On: 04/18/2019 02:40   DG Chest Port 1  View  Result Date: 04/17/2019 CLINICAL DATA:  Lethargic, possible aspiration EXAM: PORTABLE CHEST 1 VIEW COMPARISON:  04/04/2019 FINDINGS: Prior CABG. Tracheostomy and left pacer unchanged. Cardiomegaly. Bilateral lower lobe airspace opacities and small effusions. No acute bony abnormality. IMPRESSION: Bilateral atelectasis or infiltrates in the lung bases, similar to prior study. Small bilateral effusions. Cardiomegaly. Electronically Signed   By: Rolm Baptise M.D.   On: 04/17/2019 23:53     CODE STATUS:     Code Status Orders  (From admission, onward)         Start     Ordered   04/18/19 1239  Do not attempt resuscitation (DNR)  Continuous    Question Answer Comment  In the event of cardiac or respiratory ARREST Do not call a "code blue"   In the event of cardiac or respiratory ARREST Do not perform Intubation, CPR, defibrillation or ACLS   In the event of cardiac or respiratory ARREST Use medication by any route, position, wound care, and other measures to relive pain and suffering. May use oxygen, suction and manual treatment of airway obstruction as needed for comfort.      04/18/19 1252        Code Status History    Date Active Date Inactive Code Status Order ID Comments User Context   04/18/2019 0418 04/18/2019 1252 Full Code [id number]  Sidney Ace Arvella Merles, MD ED   03/27/2019 1603 04/03/2019 1932 Full Code [id number]  Enzo Bi, MD ED   Advance Care Planning Activity       TOTAL TIME TAKING CARE OF THIS PATIENT: *40* minutes.    Fritzi Mandes M.D  [facility]    CC: Primary care physician; System, Pcp Not In

## 2019-04-18 NOTE — Evaluation (Addendum)
Clinical/Bedside Swallow Evaluation Patient Details  Name: Danny Glover MRN: [id number] Date of Birth: [birth year]  Today's Date: 04/18/2019 Time: SLP Start Time (ACUTE ONLY): 1100 SLP Stop Time (ACUTE ONLY): 1200 SLP Time Calculation (min) (ACUTE ONLY): 60 min  Past Medical History:  Past Medical History:  Diagnosis Date  . CHF (congestive heart failure) (HCC)   . Diabetes mellitus without complication (HCC)   . GERD (gastroesophageal reflux disease)   . Gout   . Hyperlipemia   . Tracheostomy dependent [facility])    Past Surgical History:  Past Surgical History:  Procedure Laterality Date  . PACEMAKER PLACEMENT    . TRACHEOSTOMY     HPI:  Pt is a 69 y.o. male with a history of GERD, Tracheostomy dependent for ~6 years per pt report secondary to Vocal Cord paralysis, DM, paroxysmal atrial fibrillation on Eliquis, CHF, ICD, tracheostomy dependent, diabetes, hypertension, CAD who presents for evaluation after mechanical fall.  Patient reports that he was walking to the bathroom when he lost his balance and fell.  Patient remembers the entire incident and is able to describe it.  He reports that the fall was mechanical in nature and later that he just lost his balance.  He denies dizziness, chest pain, headache, LOC.  Pt stated he is unable to use a PMV d/t difficulty w/ exhalation when using a PMV(VC paralysis).    Assessment / Plan / Recommendation Clinical Impression  Pt appears to present w/ Mild oropharyngeal phase dysphagia, though, grossly functional oropharyngeal phase swallowing when using aspiration precautions as educated on during this eval. Pt is at reduced risk for aspiration when following aspiration precautions including Small, Single sips and using strategies to aid airway protection during/end of meals and any oral intake including strong Throat clear/Cough. These precautions and strategies were practiced w/ pt during po trials at Lunch meal. At baseline, pt has a Tracheostomy (on  RA) secondary to previous illness/surgery and Vocal Cord Paralysis (~6-15 years ago?). Pt's Vocal Quality is c/b min raspy, hoarse quality w/ quick loss of breath support for speech at word-phrase level. Pt uses Finger Occlusion for verbal communication d/t being unable to wear the PMV d/t deficits/discomfort during exhalation phase per pt report. Pt has been eating a Regular diet at home since the Tracheostomy (~6 years, though one chart note stated 15 years). Pt appears easily fatigued w/ any exertion including talking. Pt was positioned (w/ cues) for sitting fully upright for trials. Pt consumed po trials including thin liquids via Cup, purees/soft solids w/ No immediate, overt clinical s/s of aspiration noted; no decline in baseline vocal quality(raspy), no decline in respiratory effort or coughing/throat clearing noted. However, when pt took a Larger sip toward end of meal, overt coughing followed(pt endorsed he knew he took a Larger sip then). Oral phase c/b adequate lingual/labial movements for bolus management and oral clearing; min increased oral phase time needed for bolus mastication secondary to mostly Edentulous status. W/ time and alternating foods/liquids, oral clearing achieved. OM exam appeared [facility]. Pt fed self w/ setup support(shaky UEs d/t weakness).  Pt appears to present w/ increased risk for aspiration w/ quick fatigue w/ any exertion; current illness. Unsure if presence of Tracheostomy is impacting the pharyngeal swallowing at this time. Pt would benefit from an Objective swallow assessment(MBSS) next 1-2 days.  Recommend a dysphagia level 3 (mech soft foods, moistened) w/ Thin liquids VIA CUP only. Strict aspiration precautions, strategies; Pills given in Puree. Supervision during meals. Education given to pt on the  aspiration precautions and use of strategies to aid airway protection; then posted in room. ST services will continue to monitor pt's status/labs and f/u w/ MBSS to further  assess pt's baseline swallowing status; further education. SLP Visit Diagnosis: Dysphagia, oropharyngeal phase (R13.12)(Tracheostomy)    Aspiration Risk  Mild aspiration risk(reduced when following precautions)    Diet Recommendation  Mech Soft diet w/ Thin liquids VIA CUP ONLY; aspiration precautions including f/u throat clear/cough during and at end of meals/oral intake to aid airway protection; NO Straws. Reflux precautions. Monitoring at meals for follow through.  Medication Administration: Whole meds with puree(for safer swallowing)    Other  Recommendations Recommended Consults: (Dietician f/u as needed) Oral Care Recommendations: Oral care BID;Patient independent with oral care;Oral care before and after PO Other Recommendations: (n/a)   Follow up Recommendations None(TBD)      Frequency and Duration min 2x/week  1 week       Prognosis Prognosis for Safe Diet Advancement: Fair Barriers to Reach Goals: Time post onset;Severity of deficits(baseline tracheostomy)      Swallow Study   General Date of Onset: 04/15/19 HPI: Pt is a 69 y.o. male with a history of GERD, Tracheostomy dependent for ~6 years per pt report secondary to Vocal Cord paralysis, DM, paroxysmal atrial fibrillation on Eliquis, CHF, ICD, tracheostomy dependent, diabetes, hypertension, CAD who presents for evaluation after mechanical fall.  Patient reports that he was walking to the bathroom when he lost his balance and fell.  Patient remembers the entire incident and is able to describe it.  He reports that the fall was mechanical in nature and later that he just lost his balance.  He denies dizziness, chest pain, headache, LOC.  Pt stated he is unable to use a PMV d/t difficulty w/ exhalation when using a PMV(VC paralysis).  Type of Study: Bedside Swallow Evaluation Previous Swallow Assessment: none in recent years Diet Prior to this Study: Regular;Thin liquids(regular diet at home) Temperature Spikes Noted:  No(wbc 7.7) Respiratory Status: Room air(on O2 support earlier) History of Recent Intubation: No Behavior/Cognition: Alert;Cooperative;Pleasant mood;Distractible;Requires cueing(min) Oral Cavity Assessment: Within Functional Limits;Dry(min) Oral Care Completed by SLP: Yes Oral Cavity - Dentition: Missing dentition(2 teeth) Vision: Functional for self-feeding Self-Feeding Abilities: Able to feed self;Needs assist;Needs set up Patient Positioning: Upright in bed(needed positioning) Baseline Vocal Quality: Breathy;Hoarse;Low vocal intensity(Min Raspy - uses finger occlusion for speaking) Volitional Cough: Strong;Congested(min) Volitional Swallow: Able to elicit    Oral/Motor/Sensory Function Overall Oral Motor/Sensory Function: Within functional limits   Ice Chips Ice chips: Within functional limits Presentation: Spoon(fed; 2 trials)   Thin Liquid Thin Liquid: Impaired Presentation: Cup;Self Fed(~6 ozs) Oral Phase Impairments: (none) Oral Phase Functional Implications: (none) Pharyngeal  Phase Impairments: Cough - Immediate(w/ larger sip (per pt) toward end of meal) Other Comments: pt endorsed not following aspiration precautions of small, single sip (like coffee sips). Encouraged pt to use a throat clear/cough for airway protection.    Nectar Thick Nectar Thick Liquid: Not tested   Honey Thick Honey Thick Liquid: Not tested   Puree Puree: Within functional limits Presentation: Self Fed;Spoon(~3 ozs)   Solid     Solid: Impaired Presentation: Self Fed;Spoon(~4 ozd) Oral Phase Impairments: Impaired mastication(mostly edentulous) Oral Phase Functional Implications: Impaired mastication(mostly edentulous) Pharyngeal Phase Impairments: (none)         Orinda Kenner, MS, CCC-SLP Etoy Mcdonnell 04/18/2019,2:22 PM

## 2019-04-18 NOTE — ED Notes (Signed)
Daughter informed of pt's room assignment and that pt is going to floor at this time. EDT [facility] [employer] [place] transporting pt

## 2019-04-18 NOTE — TOC Initial Note (Signed)
Transition of Care [facility]) - Initial/Assessment Note    Patient Details  Name: Danny Glover MRN: [id number] Date of Birth: [birth year]  Transition of [facility]) CM/SW Contact:    Chapman Fitch, RN Phone Number: 04/18/2019, 12:58 PM  Clinical Narrative:                  Patient admitted from [facility] with weakness Patient confused with safety sitter Assessment completed with daughter via phone  Daughter states that patient has had trach for 15 years.  Followed by PA at [facility]  Daughter states they did not [facility] a bed [facility] at [facility]  She is aware that PT eval is pending and if SNF is recommended again we weill have to start a new bed search.  Daughter states that she lives in [facility] and if they would be willing to pursue placement there as well  Daughter states that she anticipates patient would require LTC after completion of rehab.  She has been working with Diane the SW at the [facility] discussed option to transfer to [place].  She is in agreement to pursue.  [facility] notified of admission by e-mail at [email or web address].  Consent to transfer completed via phone witnessed by myself and [employer].   Clinical information faxed to [facility]   Expected Discharge Plan: Skilled Nursing Facility     Patient Goals and CMS Choice        Expected Discharge Plan and Services Expected Discharge Plan: Skilled Nursing Facility                                              Prior Living Arrangements/Services   Lives with:: Facility Resident Patient language and need for interpreter reviewed:: Yes Do you feel safe going back to the place where you live?: Yes            Criminal Activity/Legal Involvement Pertinent to Current Situation/Hospitalization: No - Comment as needed  Activities of Daily Living Home Assistive Devices/Equipment: Dan Humphreys (specify type) ADL Screening (condition at time of admission) Patient's cognitive ability adequate to safely  complete daily activities?: Yes Is the patient deaf or have difficulty hearing?: No Does the patient have difficulty seeing, even when wearing glasses/contacts?: No Does the patient have difficulty concentrating, remembering, or making decisions?: No Patient able to express need for assistance with ADLs?: Yes Does the patient have difficulty dressing or bathing?: Yes Independently performs ADLs?: Yes (appropriate for developmental age) Does the patient have difficulty walking or climbing stairs?: Yes Weakness of Legs: Both Weakness of Arms/Hands: Both  Permission Sought/Granted                  Emotional Assessment       Orientation: : Fluctuating Orientation (Suspected and/or reported Sundowners) Alcohol / Substance Use: Not Applicable Psych Involvement: No (comment)  Admission diagnosis:  Dehydration [E86.0] Lethargy [R53.83] Weakness [R53.1] Tracheostomy dependent (HCC) [Z93.0] Generalized weakness [R53.1] Acute renal failure, unspecified acute renal failure type (HCC) [N17.9] Chronic venous stasis [I87.8] Acute kidney injury (AKI) with acute tubular necrosis (ATN) (HCC) [N17.0] Patient Active Problem List   Diagnosis Date Noted  . Weakness 04/18/2019  . Acute kidney injury (AKI) with acute tubular necrosis (ATN) (HCC) 04/18/2019  . Acute renal failure (HCC)   . Goals of care, counseling/discussion   . Palliative care by specialist   .  DNR (do not resuscitate) discussion   . Falls frequently 03/27/2019  . Paroxysmal atrial fibrillation ([place]) 01/03/2015  . Chronic obstructive pulmonary disease ([place]) 01/01/2015  . Chronic systolic (congestive) heart failure ([place]) 01/01/2015  . Controlled type 2 diabetes mellitus without complication ([place]) 85/88/5027  . Coronary artery disease involving native coronary artery 01/01/2015  . Essential hypertension 01/01/2015  . History of vocal cord paralysis 01/01/2015  . Hyperlipidemia 01/01/2015  . Tobacco abuse 01/01/2015  .  Tracheostomy dependent ([place]) 01/01/2015  . Biventricular ICD (implantable cardioverter-defibrillator) in place 09/01/2014   PCP:  System, Pcp Not In Pharmacy:   [place], [address] [place] [address] Phone: [phone] Fax: [phone]     Social Determinants of Health (SDOH) Interventions    Readmission Risk Interventions No flowsheet data found.

## 2019-04-18 NOTE — Progress Notes (Signed)
Terri the daughter was updated of pt. status and transfer status. Awaiting a call form Transfer UNIT at [facility] hospital for bed  Availability.

## 2019-04-18 NOTE — H&P (Signed)
[place] [facility]   PATIENT NAME: Danny Glover    MR#:  [id number]  DATE OF BIRTH:  [birth year]  DATE OF ADMISSION:  04/17/2019  PRIMARY CARE PHYSICIAN: System, Pcp Not In   REQUESTING/REFERRING PHYSICIAN: Loleta Rose, MD CHIEF COMPLAINT:   Chief Complaint  Patient presents with  . Weakness    HISTORY OF PRESENT ILLNESS:  Danny Glover  is a 69 y.o. lethargic Caucasian male with a known history of type 2 diabetes mellitus, dyslipidemia, CHF, status post tracheostomy and chronic lower extremity venous stasis with left leg venous stasis chronic ulcer, who presented to the emergency room with acute onset of generalized weakness for the last week which has been getting worse to the point that he was unable to suction his tracheostomy.  He denied any fever or chills.  No nausea or vomiting or abdominal pain.  No chest pain or palpitations.  He has been having gurgling breath sounds.  No dysuria, oliguria or hematuria or flank pain.  No worsening lower extremity edema or pain or erythema.  No headache or dizziness or blurred vision.  No paresthesias or focal muscle weakness.  Upon presentation to the emergency room respiratory it was 25 and around 20 with otherwise normal vital signs.  Labs revealed a BUN of 102 and creatinine of 2.15 compared to the 97 and 1.5 x 2 days ago however up from 56 and 1.79 on 04/03/2019.  Albumin was 3 with total protein of 7.8 and total bili of 4.6.  GFR was 30 compared to 43 2 days ago.  Procalcitonin was 0.22.  CBC showed anemia at baseline.  Urinalysis was unremarkable.  COVID-19 antigen was negative and PCR is pending.  Portable chest x-ray showed bilateral atelectasis or infiltrates in the lung bases parasternal stool prior study with small bilateral effusions.  EKG showed paced rhythm with rate of 70.  The patient was given 500 mill IV normal saline bolus.  He will be admitted to medical monitored bed for further evaluation and management. PAST  MEDICAL HISTORY:   Past Medical History:  Diagnosis Date  . CHF (congestive heart failure) (HCC)   . Diabetes mellitus without complication (HCC)   . GERD (gastroesophageal reflux disease)   . Gout   . Hyperlipemia   . Tracheostomy dependent (HCC)     PAST SURGICAL HISTORY:   Past Surgical History:  Procedure Laterality Date  . PACEMAKER PLACEMENT    . TRACHEOSTOMY      SOCIAL HISTORY:   Social History   Tobacco Use  . Smoking status: Current Every Day Smoker    Packs/day: 1.00    Types: Cigarettes  . Smokeless tobacco: Never Used  Substance Use Topics  . Alcohol use: Never    FAMILY HISTORY:   Family History  Problem Relation Age of Onset  . Hypertension Mother   . Heart attack Father     DRUG ALLERGIES:  No Known Allergies  REVIEW OF SYSTEMS:   ROS As per history of present illness. All pertinent systems were reviewed above. Constitutional,  HEENT, cardiovascular, respiratory, GI, GU, musculoskeletal, neuro, psychiatric, endocrine,  integumentary and hematologic systems were reviewed and are otherwise  negative/unremarkable except for positive findings mentioned above in the HPI.   MEDICATIONS AT HOME:   Prior to Admission medications   Medication Sig Start Date End Date Taking? Authorizing Provider  acetaminophen (TYLENOL) 325 MG tablet Take 650 mg by mouth every 6 (six) hours as needed for mild pain, fever or  headache.    Yes [provider]  allopurinol (ZYLOPRIM) 100 MG tablet Take 100 mg by mouth daily.   Yes [provider]  apixaban (ELIQUIS) 5 MG TABS tablet Take 5 mg by mouth 2 (two) times daily.    Yes [provider]  ascorbic acid (VITAMIN C) 500 MG tablet Take 500 mg by mouth 2 (two) times daily.   Yes [provider]  atorvastatin (LIPITOR) 40 MG tablet Take 40 mg by mouth at bedtime.    Yes [provider]  cholecalciferol (VITAMIN D) 25 MCG (1000 UNIT) tablet Take 5,000 Units by mouth daily.    Yes [provider]  dextromethorphan-guaiFENesin (MUCINEX DM) 30-600 MG 12hr tablet Take 1 tablet by mouth 2 (two) times daily.   Yes [provider]  docusate sodium (COLACE) 100 MG capsule Take 100 mg by mouth daily as needed for mild constipation.   Yes [provider]  ferrous sulfate 324 MG TBEC Take 324 mg by mouth daily.    Yes [provider]  gabapentin (NEURONTIN) 100 MG capsule Take 100 mg by mouth every 12 (twelve) hours.   Yes [provider]  lisinopril (ZESTRIL) 5 MG tablet Take 2.5 mg by mouth daily.   Yes [provider]  metolazone (ZAROXOLYN) 2.5 MG tablet Take 2.5 mg by mouth daily as needed (edema).    Yes [provider]  metoprolol succinate (TOPROL-XL) 100 MG 24 hr tablet Take 100 mg by mouth daily.    Yes [provider]  mirtazapine (REMERON) 7.5 MG tablet Take 7.5 mg by mouth at bedtime.   Yes [provider]  Multiple Vitamin (MULTIVITAMIN) tablet Take 1 tablet by mouth daily.   Yes [provider]  oxyCODONE (OXY IR/ROXICODONE) 5 MG immediate release tablet Take 5 mg by mouth every 6 (six) hours as needed for severe pain.   Yes [provider]  pantoprazole (PROTONIX) 40 MG tablet Take 40 mg by mouth daily.   Yes [provider]  torsemide (DEMADEX) 20 MG tablet Take 20 mg by mouth daily.    Yes [provider]  torsemide (DEMADEX) 20 MG tablet Take 40 mg by mouth daily as needed (edema).   Yes [provider]  Zinc Sulfate 220 (50 Zn) MG TABS Take 220 mg by mouth every evening.   Yes [provider]      VITAL SIGNS:  Blood pressure 138/80, pulse 71, temperature (!) 96.7 F (35.9 C), temperature source Rectal, resp. rate 20, height 5\' 9"  (1.753 m), weight 90.7 kg, SpO2 100 %.  PHYSICAL EXAMINATION:  Physical Exam  GENERAL:  69 y.o.-year-old lethargic Caucasian male patient lying in the bed with no acute distress at rest.  He had  mild conversational dyspnea with associated somnolence.  EYES: Pupils equal, round, reactive to light and accommodation. No scleral icterus. Extraocular muscles intact.  HEENT: Head atraumatic, normocephalic. Oropharynx and nasopharynx clear.  NECK:  Supple, no jugular venous distention. No thyroid enlargement, no tenderness.  LUNGS: Normal breath sounds bilaterally, no wheezing, rales,rhonchi or crepitation. No use of accessory muscles of respiration.  CARDIOVASCULAR: Regular rate and rhythm, S1, S2 normal. No murmurs, rubs, or gallops.  ABDOMEN: Soft, nondistended, nontender. Bowel sounds present. No organomegaly or mass.  EXTREMITIES: Bilateral lower extremity edema without tenderness or induration or lichenification or cyanosis, or clubbing.  NEUROLOGIC: Cranial nerves II through XII are intact. Muscle strength 5/5 in all extremities. Sensation intact. Gait not checked.  PSYCHIATRIC: The patient  is alert and oriented x 3.  Normal affect and good eye contact. SKIN: He has left lateral leg venous stasis ulcer with whitish eschar without surrounding erythema or purulent discharge.  With scattered small ulcerations around the leg.    LABORATORY PANEL:   CBC Recent Labs  Lab 04/17/19 2339  WBC 5.6  HGB 9.2*  HCT 32.9*  PLT 149*   ------------------------------------------------------------------------------------------------------------------  Chemistries  Recent Labs  Lab 04/17/19 2337  NA 134*  K 4.9  CL 96*  CO2 27  GLUCOSE 106*  BUN 102*  CREATININE 2.15*  CALCIUM 8.8*  AST 21  ALT 15  ALKPHOS 95  BILITOT 1.6*   ------------------------------------------------------------------------------------------------------------------  Cardiac Enzymes No results for input(s): TROPONINI in the last 168 hours. ------------------------------------------------------------------------------------------------------------------  RADIOLOGY:  CT Head Wo Contrast  Result Date:  04/18/2019 CLINICAL DATA:  Altered mental status EXAM: CT HEAD WITHOUT CONTRAST TECHNIQUE: Contiguous axial images were obtained from the base of the skull through the vertex without intravenous contrast. COMPARISON:  CT head 04/15/2019 FINDINGS: Brain: Stable focus of hypoattenuation in the right basal ganglia likely reflecting sequela of prior lacunar infarct. No evidence of acute infarction, hemorrhage, hydrocephalus, extra-axial collection or mass lesion/mass effect. Symmetric prominence of the ventricles, cisterns and sulci compatible with parenchymal volume loss. Patchy areas of white matter hypoattenuation are most compatible with chronic microvascular angiopathy. Vascular: Atherosclerotic calcification of the carotid siphons and intradural vertebral arteries. No hyperdense vessel. Skull: Redemonstrated area of posterior left parieto-occipital scalp swelling and small crescentic hematoma measuring up to 4 mm in maximal thickness with overlying skin staples. No subjacent calvarial fracture. No suspicious osseous lesion. Sinuses/Orbits: Paranasal sinuses and mastoid air cells are predominantly clear. Included orbital structures are unremarkable. Other: None IMPRESSION: 1. No acute intracranial abnormality. 2. Stable chronic microvascular angiopathy and parenchymal volume loss. 3. Stable remote right basal ganglia lacunar infarct. 4. Decreased left parieto-occipital scalp swelling with overlying staples. No subjacent calvarial fracture. Electronically Signed   By: Kreg Shropshire M.D.   On: 04/18/2019 02:40   DG Chest Port 1 View  Result Date: 04/17/2019 CLINICAL DATA:  Lethargic, possible aspiration EXAM: PORTABLE CHEST 1 VIEW COMPARISON:  04/04/2019 FINDINGS: Prior CABG. Tracheostomy and left pacer unchanged. Cardiomegaly. Bilateral lower lobe airspace opacities and small effusions. No acute bony abnormality. IMPRESSION: Bilateral atelectasis or infiltrates in the lung bases, similar to prior study. Small  bilateral effusions. Cardiomegaly. Electronically Signed   By: Charlett Nose M.D.   On: 04/17/2019 23:53      IMPRESSION AND PLAN:   1.  Generalized weakness that is multifactorial.  This could be related to acute kidney injury superimposed on stage IIIb chronic kidney disease.  The patient will be admitted to a medical monitored bed.  He will be placed on hydration with IV normal saline and will follow his BMP.  Physical therapy consult will be obtained.  We will monitor his COVID-19 PCR results.  2.  Chronic bilateral lower extremity venous stasis with left leg venous stasis ulcer.  Wound care consult to be obtained.  There is no clear evidence for infection and his procalcitonin was 0.22 with no leukocytosis or clear signs of cellulitis.  3.  Chronic systolic CHF.  No evidence for current exacerbation.  We are holding off diuretic therapy while cautiously hydrating him and following BMP.  4.  Paroxysmal atrial fibrillation.  He is currently in paced rhythm.  Eliquis will be continued. . 5.  COPD.  He has no current exacerbation.  Status post  tracheostomy.  Suction as needed  6.  Hypertension.  We will continue Toprol-XL and hold off lisinopril given acute kidney injury.  7.  Gout.  Allopurinol will be resumed.  8.  GERD.  PPI therapy will be resumed.  9.  Dyslipidemia.  Statin therapy will be resumed.  10.  DVT prophylaxis.  We will continue Eliquis.   All the records are reviewed and case discussed with ED provider. The plan of care was discussed in details with the patient (and family). I answered all questions. The patient agreed to proceed with the above mentioned plan. Further management will depend upon hospital course.   CODE STATUS: Full code  TOTAL TIME TAKING CARE OF THIS PATIENT: 55 minutes.    Christel Mormon M.D on 04/18/2019 at 4:18 AM  [facility]   From 7 PM-7 AM, contact night-coverage [email or web address]  CC: Primary care physician; System, Pcp Not  In   Note: This dictation was prepared with Dragon dictation along with smaller phrase technology. Any transcriptional errors that result from this process are unintentional.

## 2019-04-18 NOTE — Consult Note (Signed)
Consultation Note Date: 04/18/2019   Patient Name: Danny Glover  DOB: [birth year]  MRN: [id number]  Age / Sex: 69 y.o., male  PCP: System, Pcp Not In Referring Physician: Fritzi Mandes, MD  Reason for Consultation: Establishing goals of care and Psychosocial/spiritual support  HPI/Patient Profile: 69 y.o. male  with past medical history of  type 2 diabetes mellitus, dyslipidemia, CHF, status post tracheostomy and chronic lower extremity venous stasis with left leg venous stasis chronic ulcer admitted on 04/17/2019 with acute encephalopathy poss 2/2 acute kidney.  At [facility] since end of January 2021, unwitnessed fall 2/8 with scalp laceration.   Clinical Assessment and Goals of Care: I have reviewed medical records including EPIC notes, labs and imaging, received report from attending, examined the patient and met at bedside with patient and call to daughter Danny Glover  to discuss diagnosis prognosis, [place], EOL wishes, disposition and options.  Danny Glover appears to be quite ill and frail. He is oriented to self and place only.  He tells me that he isn't sure what happened to bring him in hospital.  There is at sitter at bedside because he has been attempting OOB, removed trach inner canula. He is able to make his basic needs known.  There is no family at bedside at this time. Danny Glover tells me that his daughter Danny Glover is his healthcare surrogate.   Call to daughter/healtcare surrogate, Danny Glover.  Danny Glover tells me that she is a paramedic, and familiar with CODE STATUS questions and chronic illness pathway.  I introduced Palliative Medicine as specialized medical care for people living with serious illness. It focuses on providing relief from the symptoms and stress of a serious illness.   As far as functional and nutritional status, prior to January 2021 Mr. Borghi had been living at home alone.  He fell and went  to [facility] for rehab.  Danny Glover shares that she feels he is unsafe to live alone and they would like to seek long-term care placement.  We discussed current illness and what it means in the larger context of on-going co-morbidities.  Natural disease trajectory and expectations at EOL were discussed.  We talked in detail about the chronic illness pathway related to heart failure, CKD.  Advanced directives, concepts specific to code status, were considered and discussed.  We talk about life support, DNR.  Daughter endorses treat the treatable but no CPR no ventilation.  Hospice and Palliative Care services outpatient were explained and offered.  Daughter agrees to outpatient palliative services to follow.  Agrees to transfer to [place] facility if needed.  Questions and concerns were addressed.  The family was encouraged to call with questions or concerns.    HCPOA    NEXT OF KIN - daughter, Danny Glover, lives in [place], [place].  No other children.  Cousin locally helping.     SUMMARY OF RECOMMENDATIONS   Time for outcomes DNR status Requesting assistance with Medicaid application  Anticipate need for long term placement episode.    Code Status/Advance Care Planning:  DNR  Symptom Management:   Per hospitalist, no additional needs at this time.   Palliative Prophylaxis:   Frequent Pain Assessment, Palliative Wound Care and Turn Reposition  Additional Recommendations (Limitations, Scope, Preferences):  Treat the treatable but no CPR/ventilation  Psycho-social/Spiritual:   Desire for further Chaplaincy support:no  Additional Recommendations: Caregiving  Support/Resources and Education on Hospice  Prognosis:   Unable to determine, based on outcomes, 6 months or less would not be surprising based on chronic illness burden, 2 hospital visits in 6 months.   Discharge Planning: anticipate return to LTC, out patient palliative to follow.       Primary Diagnoses: Present on  Admission: . Acute kidney injury (AKI) with acute tubular necrosis (ATN) (HCC)   I have reviewed the medical record, interviewed the patient and family, and examined the patient. The following aspects are pertinent.  Past Medical History:  Diagnosis Date  . CHF (congestive heart failure) ([place])   . Diabetes mellitus without complication ([place])   . GERD (gastroesophageal reflux disease)   . Gout   . Hyperlipemia   . Tracheostomy dependent [facility])    Social History   Socioeconomic History  . Marital status: Divorced    Spouse name: Not on file  . Number of children: Not on file  . Years of education: Not on file  . Highest education level: Not on file  Occupational History  . Not on file  Tobacco Use  . Smoking status: Current Every Day Smoker    Packs/day: 1.00    Types: Cigarettes  . Smokeless tobacco: Never Used  Substance and Sexual Activity  . Alcohol use: Never  . Drug use: Never  . Sexual activity: Not on file  Other Topics Concern  . Not on file  Social History Narrative  . Not on file   Social Determinants of Health   Financial Resource Strain:   . Difficulty of Paying Living Expenses: Not on file  Food Insecurity:   . Worried About Charity fundraiser in the Last Year: Not on file  . Ran Out of Food in the Last Year: Not on file  Transportation Needs:   . Lack of Transportation (Medical): Not on file  . Lack of Transportation (Non-Medical): Not on file  Physical Activity:   . Days of Exercise per Week: Not on file  . Minutes of Exercise per Session: Not on file  Stress:   . Feeling of Stress : Not on file  Social Connections:   . Frequency of Communication with Friends and Family: Not on file  . Frequency of Social Gatherings with Friends and Family: Not on file  . Attends Religious Services: Not on file  . Active Member of Clubs or Organizations: Not on file  . Attends Archivist Meetings: Not on file  . Marital Status: Not on file   Family  History  Problem Relation Age of Onset  . Hypertension Mother   . Heart attack Father    Scheduled Meds: . allopurinol  100 mg Oral Daily  . apixaban  5 mg Oral BID  . ascorbic acid  500 mg Oral Daily  . atorvastatin  40 mg Oral QHS  . cholecalciferol  5,000 Units Oral Daily  . dextromethorphan-guaiFENesin  1 tablet Oral BID  . ferrous sulfate  324 mg Oral Daily  . metoprolol succinate  100 mg Oral Daily  . mirtazapine  7.5 mg Oral QHS  . multivitamin with minerals  1 tablet Oral Daily  .  pantoprazole  40 mg Oral Daily  . zinc sulfate  220 mg Oral QPM   Continuous Infusions: . sodium chloride 100 mL/hr at 04/18/19 0843   PRN Meds:.acetaminophen **OR** acetaminophen, docusate sodium, ondansetron **OR** ondansetron (ZOFRAN) IV, oxyCODONE, traZODone Medications Prior to Admission:  Prior to Admission medications   Medication Sig Start Date End Date Taking? Authorizing Provider  acetaminophen (TYLENOL) 325 MG tablet Take 650 mg by mouth every 6 (six) hours as needed for mild pain, fever or headache.    Yes [provider]  allopurinol (ZYLOPRIM) 100 MG tablet Take 100 mg by mouth daily.   Yes [provider]  apixaban (ELIQUIS) 5 MG TABS tablet Take 5 mg by mouth 2 (two) times daily.    Yes [provider]  ascorbic acid (VITAMIN C) 500 MG tablet Take 500 mg by mouth 2 (two) times daily.   Yes [provider]  atorvastatin (LIPITOR) 40 MG tablet Take 40 mg by mouth at bedtime.    Yes [provider]  cholecalciferol (VITAMIN D) 25 MCG (1000 UNIT) tablet Take 5,000 Units by mouth daily.   Yes [provider]  dextromethorphan-guaiFENesin (MUCINEX DM) 30-600 MG 12hr tablet Take 1 tablet by mouth 2 (two) times daily.   Yes [provider]  docusate sodium (COLACE) 100 MG capsule Take 100 mg by mouth daily as needed for mild constipation.   Yes [provider]  ferrous sulfate 324 MG TBEC Take 324 mg by mouth daily.     Yes [provider]  gabapentin (NEURONTIN) 100 MG capsule Take 100 mg by mouth every 12 (twelve) hours.   Yes [provider]  lisinopril (ZESTRIL) 5 MG tablet Take 2.5 mg by mouth daily.   Yes [provider]  metolazone (ZAROXOLYN) 2.5 MG tablet Take 2.5 mg by mouth daily as needed (edema).    Yes [provider]  metoprolol succinate (TOPROL-XL) 100 MG 24 hr tablet Take 100 mg by mouth daily.    Yes [provider]  mirtazapine (REMERON) 7.5 MG tablet Take 7.5 mg by mouth at bedtime.   Yes [provider]  Multiple Vitamin (MULTIVITAMIN) tablet Take 1 tablet by mouth daily.   Yes [provider]  oxyCODONE (OXY IR/ROXICODONE) 5 MG immediate release tablet Take 5 mg by mouth every 6 (six) hours as needed for severe pain.   Yes [provider]  pantoprazole (PROTONIX) 40 MG tablet Take 40 mg by mouth daily.   Yes [provider]  torsemide (DEMADEX) 20 MG tablet Take 20 mg by mouth daily.    Yes [provider]  torsemide (DEMADEX) 20 MG tablet Take 40 mg by mouth daily as needed (edema).   Yes [provider]  Zinc Sulfate 220 (50 Zn) MG TABS Take 220 mg by mouth every evening.   Yes [provider]   No Known Allergies Review of Systems  Unable to perform ROS: Acuity of condition    Physical Exam Vitals and nursing note reviewed.  Constitutional:      General: He is not in acute distress.    Appearance: He is ill-appearing.  Cardiovascular:     Rate and Rhythm: Normal rate.  Pulmonary:     Effort: Pulmonary effort is normal. No respiratory distress.  Skin:    General: Skin is warm and dry.     Comments: BL LE with dressing CDI   Neurological:     Mental Status: He is alert.  Comments: oriented to self and place.   Psychiatric:     Comments: Calm and cooperative.      Vital Signs: BP 133/69 (BP Location: Left Arm)   Pulse 70   Temp 97.6 F (36.4 C) (Oral)   Resp  (!) 24   Ht 5' 9"  (1.753 m)   Wt 90.7 kg   SpO2 98%   BMI 29.53 kg/m  Pain Scale: 0-10   Pain Score: 0-No pain   SpO2: SpO2: 98 % O2 Device:SpO2: 98 % O2 Flow Rate: .O2 Flow Rate (L/min): 5 L/min  IO: Intake/output summary:   Intake/Output Summary (Last 24 hours) at 04/18/2019 1151 Last data filed at 04/18/2019 1000 Gross per 24 hour  Intake --  Output 775 ml  Net -775 ml    LBM:   Baseline Weight: Weight: 90.7 kg Most recent weight: Weight: 90.7 kg     Palliative Assessment/Data:   Flowsheet Rows     Most Recent Value  Intake Tab  Referral Department  Hospitalist  Unit at Time of Referral  Med/Surg Unit  Palliative Care Primary Diagnosis  Nephrology  Date Notified  04/18/19  Palliative Care Type  New Palliative care  Reason for referral  Clarify Goals of Care  Date of Admission  04/17/19  Date first seen by Palliative Care  04/18/19  # of days Palliative referral response time  0 Day(s)  # of days IP prior to Palliative referral  1  Clinical Assessment  Palliative Performance Scale Score  30%  Pain Max last 24 hours  Not able to report  Pain Min Last 24 hours  Not able to report  Dyspnea Max Last 24 Hours  Not able to report  Dyspnea Min Last 24 hours  Not able to report  Psychosocial & Spiritual Assessment  Palliative Care Outcomes      Time In: 0920 Time Out: 1030 Time Total: 70 minutes  Greater than 50%  of this time was spent counseling and coordinating care related to the above assessment and plan.  Signed by: Drue Novel, NP   Please contact Palliative Medicine Team phone at [phone] for questions and concerns.  For individual provider: See Shea Evans

## 2019-04-18 NOTE — ED Notes (Signed)
Respiratory at bedside to suction.

## 2019-04-18 NOTE — Progress Notes (Signed)
Per MD DC 1:1 safety sitter. Pt is calm and alert at this time. Has not attempted to pulled out inner canula since admission on floor.

## 2019-04-18 NOTE — Progress Notes (Signed)
PT Cancellation Note  Patient Details Name: Andrey Mccaskill MRN: [id number] DOB: 02/07/1951   Cancelled Treatment:    Reason Eval/Treat Not Completed: Other (comment): Per nursing pt preparing for transfer to [facility] hospital.  Will hold PT evaluation at this time but will see pt at a later date/time if discharge to [facility] does not go through.    Ovidio Hanger PT, DPT 04/18/19, 2:59 PM

## 2019-04-18 NOTE — TOC Progression Note (Signed)
Transition of Care [facility]) - Progression Note    Patient Details  Name: Danny Glover MRN: [id number] Date of Birth: 07-May-1950  [facility] CM/SW Contact  Chapman Fitch, RN Phone Number: 04/18/2019, 2:47 PM  Clinical Narrative:     Notified by Dr Allena Katz that patient has been accepted to [facility] Transfer center to call the floor Bedside RN notified  Patient's daughter notified.  MD and RN to complete EMTALA and medical necessity   Bedside RN to notify daughter when patient is ready for transfer  Expected Discharge Plan: Skilled Nursing Facility    Expected Discharge Plan and Services Expected Discharge Plan: Skilled Nursing Facility                                               Social Determinants of Health (SDOH) Interventions    Readmission Risk Interventions No flowsheet data found.

## 2019-04-18 NOTE — Progress Notes (Signed)
Nursing reports patient with confusion regarding current situation/state of health, care requirements.  Several incidents of removing essential IV access monitoring leads and also inadvertently pulled out inner cannula of tracheostomy tube.  Due to stated confusion and high risk of decompensation with removal of life saving, sustaining equipment a one-to-one sitter is necessary and has been ordered.

## 2019-04-18 NOTE — ED Notes (Signed)
Patient reports bed is uncomfortable on buttocks. Patient repositioned in bed; pillows placed to relieve pressure. Patient reports increased comfort.

## 2019-04-18 NOTE — ED Notes (Signed)
Pt arrived to ED with bilateral legs wrapped in gauze and ace bandage, drainage saturatin gdressing. Pts legs unwrapped at this time, pt noted to have on left leg 1 large wound with slough and multiple granular spots. Right leg has 1 large granular spot on back of leg.

## 2019-04-19 ENCOUNTER — Ambulatory Visit (HOSPITAL_COMMUNITY)
Admission: AD | Admit: 2019-04-19 | Discharge: 2019-04-19 | Disposition: A | Discharge status: Home / Self Care | Payer: No Typology Code available for payment source | Source: Other Acute Inpatient Hospital | Attending: Internal Medicine | Admitting: Internal Medicine

## 2019-04-19 ENCOUNTER — Inpatient Hospital Stay: Payer: No Typology Code available for payment source

## 2019-04-19 DIAGNOSIS — N179 Acute kidney failure, unspecified: Secondary | ICD-10-CM

## 2019-04-19 DIAGNOSIS — R531 Weakness: Secondary | ICD-10-CM

## 2019-04-19 DIAGNOSIS — G9341 Metabolic encephalopathy: Secondary | ICD-10-CM

## 2019-04-19 DIAGNOSIS — I878 Other specified disorders of veins: Secondary | ICD-10-CM

## 2019-04-19 LAB — URINE CULTURE: Culture: NO GROWTH

## 2019-04-19 LAB — GLUCOSE, CAPILLARY: Glucose-Capillary: 102 mg/dL — ABNORMAL HIGH (ref 70–99)

## 2019-04-19 MED ORDER — MIDAZOLAM HCL 2 MG/2ML IJ SOLN
1.0000 mg | Freq: Once | INTRAMUSCULAR | Status: AC
  Administered 2019-04-19: 1 mg via INTRAVENOUS
  Filled 2019-04-19: qty 2

## 2019-04-19 MED ORDER — METOPROLOL TARTRATE 50 MG PO TABS
50.0000 mg | ORAL_TABLET | Freq: Two times a day (BID) | ORAL | Status: DC
  Administered 2019-04-20: 50 mg via ORAL
  Filled 2019-04-19: qty 1

## 2019-04-19 NOTE — Progress Notes (Signed)
CH responded to page for RR in pt.'s rm. @ 12:39am.  Medical team present in rm. when [facility] arrived; pt. had pulled out his trach tube reportedly.  CH consulted chart while waiting for team to finish their treatment; palliative care has been involved and family may visit today (2/12); CH assesses it may be helpful for other chaplains to follow up if family visits or if staff needs assistance.  No other needs expressed at this time.        04/19/19 0039  Clinical Encounter Type  Visited With Health care provider;Patient  Visit Type Other (Comment) (Rapid Response)  Referral From Nurse  Consult/Referral To Chaplain

## 2019-04-19 NOTE — Progress Notes (Signed)
Trach care completed w/RN. Inner cannula completely clogged due to thick/tenacious secretions & inner cannula size.

## 2019-04-19 NOTE — Progress Notes (Signed)
Updated daughter Doreen Salvage.

## 2019-04-19 NOTE — Evaluation (Signed)
Objective Swallowing Evaluation: Type of Study: MBS-Modified Barium Swallow Study   Patient Details  Name: Danny Glover MRN: [id number] Date of Birth: 1950-08-04  Today's Date: 04/19/2019 Time: SLP Start Time (ACUTE ONLY): 54 -SLP Stop Time (ACUTE ONLY): 1230  SLP Time Calculation (min) (ACUTE ONLY): 60 min   Past Medical History:  Past Medical History:  Diagnosis Date  . CHF (congestive heart failure) ([place])   . Diabetes mellitus without complication ([place])   . GERD (gastroesophageal reflux disease)   . Gout   . Hyperlipemia   . Tracheostomy dependent [facility])    Past Surgical History:  Past Surgical History:  Procedure Laterality Date  . PACEMAKER PLACEMENT    . TRACHEOSTOMY     HPI: Pt is a 69 y.o. male with a history of GERD, Tracheostomy dependent for ~6 years per pt report secondary to Vocal Cord paralysis, DM, paroxysmal atrial fibrillation on Eliquis, CHF, ICD, tracheostomy dependent, diabetes, hypertension, CAD who presents for evaluation after mechanical fall.  Patient reports that he was walking to the bathroom when he lost his balance and fell.  Patient remembers the entire incident and is able to describe it.  He reports that the fall was mechanical in nature and later that he just lost his balance.  He denies dizziness, chest pain, headache, LOC.  Pt stated he is unable to use a PMV d/t difficulty w/ exhalation when using a PMV(VC paralysis).    Subjective: Pt lethargic but responsive to POs    Assessment / Plan / Recommendation  CHL IP CLINICAL IMPRESSIONS 04/19/2019  Clinical Impression The patient is presenting with mild oropharyngeal dysphagia characterized by slow/prolonged posterior transfer, delayed pharyngeal pressure generation, and mild pharyngeal residue.  The was no observed laryngeal penetration observed across consistencies and methods of intake, including straw.  The patient was somewhat lethargic but was able to swallow safely.  Recommend continuing with  his current diet, Dysphagia 3 with thin liquids.  SLP Visit Diagnosis Dysphagia, oropharyngeal phase (R13.12)  Attention and concentration deficit following --  Frontal lobe and executive function deficit following --  Impact on safety and function Mild aspiration risk      CHL IP TREATMENT RECOMMENDATION 04/19/2019  Treatment Recommendations Therapy as outlined in treatment plan below     Prognosis 04/19/2019  Prognosis for Safe Diet Advancement Good  Barriers to Reach Goals --  Barriers/Prognosis Comment --    No flowsheet data found.    CHL IP OTHER RECOMMENDATIONS 04/18/2019  Recommended Consults --  Oral Care Recommendations --  Other Recommendations (No Data)      CHL IP FOLLOW UP RECOMMENDATIONS 04/19/2019  Follow up Recommendations None      CHL IP FREQUENCY AND DURATION 04/19/2019  Speech Therapy Frequency (ACUTE ONLY) min 2x/week  Treatment Duration 1 week           CHL IP ORAL PHASE 04/19/2019  Oral Phase Impaired  Oral - Pudding Teaspoon --  Oral - Pudding Cup --  Oral - Honey Teaspoon --  Oral - Honey Cup --  Oral - Nectar Teaspoon --  Oral - Nectar Cup --  Oral - Nectar Straw --  Oral - Thin Teaspoon --  Oral - Thin Cup --  Oral - Thin Straw --  Oral - Puree --  Oral - Mech Soft --  Oral - Regular --  Oral - Multi-Consistency --  Oral - Pill --  Oral Phase - Comment Slow oral management, ? due to lethargy    CHL IP  PHARYNGEAL PHASE 04/19/2019  Pharyngeal Phase Impaired  Pharyngeal- Pudding Teaspoon --  Pharyngeal --  Pharyngeal- Pudding Cup --  Pharyngeal --  Pharyngeal- Honey Teaspoon --  Pharyngeal --  Pharyngeal- Honey Cup --  Pharyngeal --  Pharyngeal- Nectar Teaspoon --  Pharyngeal --  Pharyngeal- Nectar Cup --  Pharyngeal --  Pharyngeal- Nectar Straw --  Pharyngeal --  Pharyngeal- Thin Teaspoon --  Pharyngeal --  Pharyngeal- Thin Cup --  Pharyngeal --  Pharyngeal- Thin Straw --  Pharyngeal --  Pharyngeal- Puree --  Pharyngeal  --  Pharyngeal- Mechanical Soft --  Pharyngeal --  Pharyngeal- Regular --  Pharyngeal --  Pharyngeal- Multi-consistency --  Pharyngeal --  Pharyngeal- Pill --  Pharyngeal --  Pharyngeal Comment Reduced pharyngeal pressure generation, mild pharyngeal residue     No flowsheet data found.  Dollene Primrose, MS/CCC- SLP  Leandrew Koyanagi 04/19/2019, 1:43 PM

## 2019-04-19 NOTE — Progress Notes (Signed)
[facility] [place] NAME: Danny Glover    MR#:  [id number]  DATE OF BIRTH:  Aug 07, 1950  SUBJECTIVE:   Patient has been moaning, wakes up to verbal command sees a few words and starts moaning and making grunting sound. Pulled his trach tube out last night-- rapid response was called. Currently has sitter in the room. IV pulled out. Mittens placed REVIEW OF SYSTEMS:   Review of Systems  Unable to perform ROS: Mental acuity   Tolerating Diet:very little Tolerating PT: pending  DRUG ALLERGIES:  No Known Allergies  VITALS:  Blood pressure (!) 143/79, pulse 69, temperature 97.6 F (36.4 C), temperature source Oral, resp. rate 20, height 5\' 9"  (1.753 m), weight 90.7 kg, SpO2 100 %.  PHYSICAL EXAMINATION:   Physical Exam  GENERAL:  69 y.o.-year-Glover patient lying in the bed with no acute distress.  Appears chronically ill EYES: Pupils equal, round, reactive to light and accommodation. No scleral icterus.   HEENT: Head atraumatic, normocephalic. Oropharynx and nasopharynx clear.  NECK:  Supple, no jugular venous distention. No thyroid enlargement, no tenderness. Trach + LUNGS: Normal breath sounds bilaterally, no wheezing, rales, rhonchi. No use of accessory muscles of respiration.  CARDIOVASCULAR: S1, S2 normal. No murmurs, rubs, or gallops.  ABDOMEN: Soft, nontender, nondistended. Bowel sounds present. No organomegaly or mass.  EXTREMITIES:     Bilateral lower extremity swelling with significant dry skin and onychomycosis NEUROLOGIC: moves upper and lower extremity. Unable to assess since patient is not his usual self PSYCHIATRIC:  patient is alert however falls back to sleep easily SKIN: as above   LABORATORY PANEL:  CBC Recent Labs  Lab 04/18/19 0625  WBC 7.7  HGB 9.2*  HCT 31.6*  PLT 159    Chemistries  Recent Labs  Lab 04/17/19 2337 04/17/19 2337 04/18/19 0703  NA 134*   < > 136  K 4.9   < > 4.7  CL 96*   < > 98    CO2 27   < > 25  GLUCOSE 106*   < > 103*  BUN 102*   < > 99*  CREATININE 2.15*   < > 1.84*  CALCIUM 8.8*   < > 8.7*  MG  --   --  1.7  AST 21  --   --   ALT 15  --   --   ALKPHOS 95  --   --   BILITOT 1.6*  --   --    < > = values in this interval not displayed.   Cardiac Enzymes No results for input(s): TROPONINI in the last 168 hours. RADIOLOGY:  CT Head Wo Contrast  Result Date: 04/18/2019 CLINICAL DATA:  Altered mental status EXAM: CT HEAD WITHOUT CONTRAST TECHNIQUE: Contiguous axial images were obtained from the base of the skull through the vertex without intravenous contrast. COMPARISON:  CT head 04/15/2019 FINDINGS: Brain: Stable focus of hypoattenuation in the right basal ganglia likely reflecting sequela of prior lacunar infarct. No evidence of acute infarction, hemorrhage, hydrocephalus, extra-axial collection or mass lesion/mass effect. Symmetric prominence of the ventricles, cisterns and sulci compatible with parenchymal volume loss. Patchy areas of white matter hypoattenuation are most compatible with chronic microvascular angiopathy. Vascular: Atherosclerotic calcification of the carotid siphons and intradural vertebral arteries. No hyperdense vessel. Skull: Redemonstrated area of posterior left parieto-occipital scalp swelling and small crescentic hematoma measuring up to 4 mm in maximal thickness with overlying skin staples. No subjacent calvarial fracture. No  suspicious osseous lesion. Sinuses/Orbits: Paranasal sinuses and mastoid air cells are predominantly clear. Included orbital structures are unremarkable. Other: None IMPRESSION: 1. No acute intracranial abnormality. 2. Stable chronic microvascular angiopathy and parenchymal volume loss. 3. Stable remote right basal ganglia lacunar infarct. 4. Decreased left parieto-occipital scalp swelling with overlying staples. No subjacent calvarial fracture. Electronically Signed   By: Kreg Shropshire M.D.   On: 04/18/2019 02:40   DG  Chest Port 1 View  Result Date: 04/17/2019 CLINICAL DATA:  Lethargic, possible aspiration EXAM: PORTABLE CHEST 1 VIEW COMPARISON:  04/04/2019 FINDINGS: Prior CABG. Tracheostomy and left pacer unchanged. Cardiomegaly. Bilateral lower lobe airspace opacities and small effusions. No acute bony abnormality. IMPRESSION: Bilateral atelectasis or infiltrates in the lung bases, similar to prior study. Small bilateral effusions. Cardiomegaly. Electronically Signed   By: Charlett Nose M.D.   On: 04/17/2019 23:53   ASSESSMENT AND PLAN:  SammySmithis a69 y.o.lethargic Caucasian malewith a known history of type 2 diabetes mellitus, dyslipidemia, CHF, status post tracheostomy and chronic lower extremity venous stasis with left leg venous stasis chronic ulcer, who presented to the emergency room with acute onset of generalized weakness for the last week which has been getting worse to the point that he was unable to suction his tracheostomy  1. Acute metabolic Encephalopathy with weakness and some lethargy/Generalized weakness -This could be related to acute kidney injurysuperimposed on stage IIIb chronic kidney disease with high BUN causing uremia -Nephrology consult requested. recommends 24 hour urine protein. -cont IV fluids.  -Physical therapy consulted--pending - COVID-19 PCR negative -pt's mentation is waxing and wanning -creat down to 1.84 (2.17) -BUN 102--99 ?poor muscle mass  2. Chronic bilateral lower extremity venous stasis with left leg venous stasis ulcer. - Wound care consult to be obtained. There is no clear evidence for infection and his procalcitonin was 0.22 with no leukocytosis or clear signs of cellulitis. -continue wound care per recs -patient has chronic falls at home secondary to venous ulcers, leg edema and possible neuropathy  3. Chronic systolic CHF.No evidence for current exacerbation.  -We are holding off diuretic therapy while cautiously hydrating him and  following BMP.  4. Paroxysmal atrial fibrillation. -He is currently in paced rhythm. Eliquis will be continued. . 5. COPD.  -He has no current exacerbation. Status post tracheostomy. Suction as needed -Speech therapy to follow swallow evaluation-- modified barium swallow done recommends dysphagia 3 with thin liquids  6. Hypertension. - continue Toprol-XL and hold off lisinopril given acute kidney injury.  7. Gout.Allopurinol will be resumed.  8. GERD.PPI therapy will be resumed.  9. Dyslipidemia.Statin therapy   10. DVT prophylaxis. continue Eliquis  11. Recent left parietal-occipital small hematoma  S/p suture on 04/15/2019--repeat CT head 04/17/19--shows decreasing hematoma   Spoke with daughter Lyla Son on the phone. She understands patient is progressively declining over the last 4 to 5 months. Patient is a DNR. She is in agreement with palliative care follow-up as outpatient. Palliative care consultation appreciated. Long-term poor prognosis given multitude of comorbidities  Procedures:none Family communication :dter on the phone Consults :Nephrology Discharge Disposition : transfer to [place]  CODE STATUS: DNR DVT Prophylaxis : eliquis Barriers to discharge: [facility], waxing and waning mentation, PT consultation pending  TOTAL TIME TAKING CARE OF THIS PATIENT: *30* minutes.  >50% time spent on counselling and coordination of care  Note: This dictation was prepared with Dragon dictation along with smaller phrase technology. Any transcriptional errors that result from this process are  unintentional.  Enedina Finner M.D    [facility]   CC: Primary care physician; System, Pcp Not InPatient ID: Allie Gerhold, male   DOB: 11/17/1950, 69 y.o.   MRN: [id number]

## 2019-04-19 NOTE — Progress Notes (Signed)
Rapid response team and RT were called because patient pulled out trach. Vital signs were stable and patient's oxygen saturation was 93% on RA without trach. RT replaced trach and collar and suctioned patient.  Called NP Jon Billings regarding 1 on 1 sitter and IV medication to help with restlessness. A low bed was also ordered.  Awaiting these orders.  Will continue to monitor.  Arturo Morton  04/19/2019  1:24 AM

## 2019-04-19 NOTE — Progress Notes (Signed)
Rapid Response initiated due to Patient pulling out his Trach, RT x3 responded. Janina Mayo was replaced with #4 cuffless Shiley with non-disposable inner cannula. Patient tolerated trach insertion well. Patient coughed up thick clear/pink tinged secretions. Patient was suctioned and clean dressing applied. Secondary #4 Shiley cuffless trach replaced and all necessary equipment and checklist is present at bedside. Will continue to monitor.

## 2019-04-19 NOTE — TOC Progression Note (Signed)
Transition of Care [facility]) - Progression Note    Patient Details  Name: Danny Glover MRN: [id number] Date of Birth: 09-29-1950  Transition of Care [facility]) CM/SW Contact  Chapman Fitch, RN Phone Number: 04/19/2019, 11:12 AM  Clinical Narrative:    Sherron Monday with Jasmine December at the [place]. Patient is still on the waiting list, however there is no bed available at this time.  MD and RN notified    Expected Discharge Plan: Skilled Nursing Facility    Expected Discharge Plan and Services Expected Discharge Plan: Skilled Nursing Facility         Expected Discharge Date: 04/18/19                                     Social Determinants of Health (SDOH) Interventions    Readmission Risk Interventions Readmission Risk Prevention Plan 04/18/2019  Transportation Screening Complete  Medication Review [employer]) Complete  Some recent data might be hidden

## 2019-04-19 NOTE — Evaluation (Signed)
Physical Therapy Evaluation Patient Details Name: Danny Glover MRN: [id number] DOB: Jan 26, 1951 Today's Date: 04/19/2019   History of Present Illness  Per MD notes: Pt is a 69 y.o. lethargic Caucasian male with a known history of type 2 diabetes mellitus, dyslipidemia, CHF, status post tracheostomy and chronic lower extremity venous stasis with left leg venous stasis chronic ulcer, who presented to the emergency room with acute onset of generalized weakness which has been getting worse to the point that he was unable to suction his tracheostomy.  MD assessment includes: Acute metabolic encephalopathy with weakness and some lethargy/generalized weakness, chronic bilateral lower extremity venous stasis with left leg venous stasis ulcer, chronic CHF, A-fib, COPD, HTN, gout, GERD, recent left parietal-occipital small hematoma.    Clinical Impression  Pt lethargic with difficulty answering questions this session but with encouragement and verbal/tactile stimulation was able to follow simple commands and participate during the session. Pt performed several exercises per below but with minimal effort and small amplitude.  Pt was able to sit at the EOB with BUE and BLE support for 2-3 min before requesting to return to supine secondary to fatigue.  Pt provided encouragement to attempt to stand but declined.  Overall pt presents with significant deficits in function with noted decline compared to recent prior admission and would not be safe to return to his prior living situation at this time.  Pt will benefit from PT services in a SNF setting upon discharge to safely address deficits listed in patient problem list for decreased caregiver assistance and eventual return to PLOF.      Follow Up Recommendations SNF    Equipment Recommendations  None recommended by PT    Recommendations for Other Services       Precautions / Restrictions Precautions Precautions: Fall Restrictions Weight Bearing  Restrictions: No      Mobility  Bed Mobility Overal bed mobility: Needs Assistance Bed Mobility: Supine to Sit;Sit to Supine;Rolling Rolling: Mod assist;Max assist   Supine to sit: Mod assist;+2 for physical assistance;Max assist Sit to supine: +2 for physical assistance;Mod assist;Max assist   General bed mobility comments: Heavy +2 assist for BLE and trunk control  Transfers                 General transfer comment: Pt declined transfer attempt secondary to fatigue  Ambulation/Gait                Stairs            Wheelchair Mobility    Modified Rankin (Stroke Patients Only)       Balance Overall balance assessment: Needs assistance Sitting-balance support: Feet supported;Bilateral upper extremity supported Sitting balance-Leahy Scale: Fair         Standing balance comment: Unable to stand secondary to fatigue                             Pertinent Vitals/Pain Pain Assessment: No/denies pain    Home Living Family/patient expects to be discharged to:: Private residence   Available Help at Discharge: Personal care attendant Type of Home: Apartment Home Access: Level entry     Home Layout: One level Home Equipment: Walker - 4 wheels;Bedside commode;Grab bars - tub/shower;Other (comment);Walker - 2 wheels      Prior Function Level of Independence: Independent with assistive device(s)         Comments: Mod Ind amb limited community distances with a rollator, Ind with ADLs; above  history from recent prior admission secondary to pt unable to provide history this session     Hand Dominance        Extremity/Trunk Assessment   Upper Extremity Assessment Upper Extremity Assessment: Generalized weakness    Lower Extremity Assessment Lower Extremity Assessment: Generalized weakness       Communication   Communication: Tracheostomy  Cognition Arousal/Alertness: Lethargic Behavior During Therapy: Flat affect Overall  Cognitive Status: Difficult to assess                                        General Comments      Exercises Total Joint Exercises Ankle Circles/Pumps: AROM;Strengthening;Both;10 reps Long Arc Quad: AROM;Strengthening;Both;10 reps Knee Flexion: AROM;Strengthening;Both;10 reps   Assessment/Plan    PT Assessment Patient needs continued PT services  PT Problem List Decreased strength;Decreased activity tolerance;Decreased balance;Decreased mobility;Decreased knowledge of use of DME       PT Treatment Interventions DME instruction;Gait training;Functional mobility training;Therapeutic activities;Therapeutic exercise;Balance training;Patient/family education    PT Goals (Current goals can be found in the Care Plan section)  Acute Rehab PT Goals Patient Stated Goal: To get stronger PT Goal Formulation: With patient Time For Goal Achievement: 05/02/19 Potential to Achieve Goals: Fair    Frequency Min 2X/week   Barriers to discharge Inaccessible home environment;Decreased caregiver support      Co-evaluation               AM-PAC PT "6 Clicks" Mobility  Outcome Measure Help needed turning from your back to your side while in a flat bed without using bedrails?: A Lot Help needed moving from lying on your back to sitting on the side of a flat bed without using bedrails?: A Lot Help needed moving to and from a bed to a chair (including a wheelchair)?: Total Help needed standing up from a chair using your arms (e.g., wheelchair or bedside chair)?: Total Help needed to walk in hospital room?: Total Help needed climbing 3-5 steps with a railing? : Total 6 Click Score: 8    End of Session Equipment Utilized During Treatment: Gait belt Activity Tolerance: Patient limited by fatigue Patient left: in bed;with call bell/phone within reach;with nursing/sitter in room;Other (comment)(Bed alarm left off per sitter request) Nurse Communication: Mobility status PT  Visit Diagnosis: Muscle weakness (generalized) (M62.81);Difficulty in walking, not elsewhere classified (R26.2);History of falling (Z91.81)    Time: 0762-2633 PT Time Calculation (min) (ACUTE ONLY): 16 min   Charges:   PT Evaluation $PT Eval Moderate Complexity: 1 Mod         D. Scott Mayson Mcneish PT, DPT 04/19/19, 4:32 PM

## 2019-04-19 NOTE — Progress Notes (Signed)
Palliative: Danny Glover is sleeping soundly with sitter at bedside for safety.  It seems that he experienced trouble breathing and removed his inner cannula.  Conference with attending and nursing staff related to patient condition, needs.  Call to daughter, Danny Glover.  We talked about Danny Glover issues overnight, pulling out inner cannula.  I share that he was sleeping comfortably when I last saw him.  We talked about Danny Glover been accepted to [facility] facility and transfer to [facility] facility when a bed is available.    I share my concern over Danny Glover slow recovery, sharing the quicker people get better the better recovery they have.  Danny Glover agrees and shares her concerns that Danny Glover has very short periods of time of relative wellness, before he is rehospitalized.  We talk briefly about end-of-life choices.  Plan:   Continue to treat the treatable but no CPR, no ventilation.  Transfer to [facility] facility.  Was at [facility] for short-term rehab, anticipate need for long-term care.  25 minutes Danny Carmel, NP Palliative Medicine Team Team Phone # [phone] Greater than 50% of this time was spent counseling and coordinating care related to the above assessment and plan.

## 2019-04-20 MED ORDER — METOPROLOL TARTRATE 50 MG PO TABS: 50.0000 mg | ORAL_TABLET | Freq: Two times a day (BID) | ORAL | 0 refills | Status: AC

## 2019-04-20 NOTE — Progress Notes (Signed)
Patient seen earlier in shift 2350 for assessment.  Moaning and grunting is how I found him.  Asked if he wanted to be suctioned. Patient responded no. BBS were coarse with rhonci therefore patient allowed me to suction him. Very thick yellow secretions noted. Suctioned trach x2. Patient with good strong productive cough afterwards. Room air saturation noted at 95-97%. Patient moving air well. Ambu bag available as well as additional trach supplies

## 2019-04-20 NOTE — Progress Notes (Signed)
Updated daughter Doreen Salvage of her father's transfer to the [place] in [place]

## 2019-04-20 NOTE — Progress Notes (Signed)
Patient suctioned during the night- thick yellow secretions noted. Patient had worn external catheter for 24 hour urine collection but then external catheter leaked. 24 hr collection had to be stopped for this reason.  Patient is scheduled to transfer to [facility] via [place] this am at 0830.

## 2019-04-20 NOTE — Progress Notes (Signed)
Pleas Patricia to be D/C'd to the [facility] in [place] per MD order.  Discussed prescriptions and follow up appointments with the patient. Prescriptions given to patient, medication list explained in detail. Pt verbalized understanding.  Allergies as of 04/20/2019   No Known Allergies      Medication List     STOP taking these medications    lisinopril 5 MG tablet Commonly known as: ZESTRIL   metolazone 2.5 MG tablet Commonly known as: ZAROXOLYN   metoprolol succinate 100 MG 24 hr tablet Commonly known as: TOPROL-XL   torsemide 20 MG tablet Commonly known as: DEMADEX       TAKE these medications    acetaminophen 325 MG tablet Commonly known as: TYLENOL Take 650 mg by mouth every 6 (six) hours as needed for mild pain, fever or headache.   allopurinol 100 MG tablet Commonly known as: ZYLOPRIM Take 100 mg by mouth daily.   apixaban 5 MG Tabs tablet Commonly known as: ELIQUIS Take 5 mg by mouth 2 (two) times daily.   ascorbic acid 500 MG tablet Commonly known as: VITAMIN C Take 500 mg by mouth 2 (two) times daily.   atorvastatin 40 MG tablet Commonly known as: LIPITOR Take 40 mg by mouth at bedtime.   cholecalciferol 25 MCG (1000 UNIT) tablet Commonly known as: VITAMIN D Take 5,000 Units by mouth daily.   dextromethorphan-guaiFENesin 30-600 MG 12hr tablet Commonly known as: MUCINEX DM Take 1 tablet by mouth 2 (two) times daily.   docusate sodium 100 MG capsule Commonly known as: COLACE Take 100 mg by mouth daily as needed for mild constipation.   ferrous sulfate 324 MG Tbec Take 324 mg by mouth daily.   gabapentin 100 MG capsule Commonly known as: NEURONTIN Take 100 mg by mouth every 12 (twelve) hours.   metoprolol tartrate 50 MG tablet Commonly known as: LOPRESSOR Take 1 tablet (50 mg total) by mouth 2 (two) times daily.   mirtazapine 7.5 MG tablet Commonly known as: REMERON Take 7.5 mg by mouth at bedtime.   multivitamin tablet Take 1 tablet by mouth  daily.   oxyCODONE 5 MG immediate release tablet Commonly known as: Oxy IR/ROXICODONE Take 5 mg by mouth every 6 (six) hours as needed for severe pain.   pantoprazole 40 MG tablet Commonly known as: PROTONIX Take 40 mg by mouth daily.   Zinc Sulfate 220 (50 Zn) MG Tabs Take 220 mg by mouth every evening.        Vitals:   04/20/19 1145 04/20/19 1149  BP: (!) 114/57 126/63  Pulse: 69 69  Resp: (!) 24 20  Temp: (!) 97.2 F (36.2 C) 97.6 F (36.4 C)  SpO2: 93%     Skin clean, dry and intact without evidence of skin break down, no evidence of skin tears noted. IV catheter discontinued intact. Site without signs and symptoms of complications. Dressing and pressure applied. Pt denies pain at this time. No complaints noted.  An After Visit Summary was printed and given to the patient. Patient escorted via WC, and D/C home via private auto.  Andrika Peraza A Wladyslawa Disbro

## 2019-04-20 NOTE — Discharge Summary (Addendum)
[facility] [facility] [place] NAME: Danny Glover    MR#:  [id number]  DATE OF BIRTH:  04-29-1950  DATE OF ADMISSION:  04/17/2019 ADMITTING PHYSICIAN: Christel Mormon, MD  DATE OF DISCHARGE: 04/20/2019  PRIMARY CARE PHYSICIAN: System, Pcp Not In    ADMISSION DIAGNOSIS:  Dehydration [E86.0] Lethargy [R53.83] Weakness [R53.1] Tracheostomy dependent (HCC) [Z93.0] Generalized weakness [R53.1] Acute renal failure, unspecified acute renal failure type (HCC) [N17.9] Chronic venous stasis [I87.8] Acute kidney injury (AKI) with acute tubular necrosis (ATN) (HCC) [N17.0]  DISCHARGE DIAGNOSIS:  Acute encephalopathy/Weakness/Lethargy--metabolic/[place] Acute on Chronic Renal failure suspect pre-renal Chronic bilateral LE venous ulcers with chronic leg edema Recurrent Falls with recent left Parieto-Occipital hematoma (feb 8th)  SECONDARY DIAGNOSIS:   Past Medical History:  Diagnosis Date  . CHF (congestive heart failure) ([place])   . Diabetes mellitus without complication ([place])   . GERD (gastroesophageal reflux disease)   . Gout   . Hyperlipemia   . Tracheostomy dependent [facility])     HOSPITAL COURSE:  Danny Glover  is a 69 y.o. lethargic Caucasian male with a known history of type 2 diabetes mellitus, dyslipidemia, CHF, status post tracheostomy and chronic lower extremity venous stasis with left leg venous stasis chronic ulcer, who presented to the emergency room with acute onset of generalized weakness for the last week which has been getting worse to the point that he was unable to suction his tracheostomy  1.  Acute metabolic Encephalopathy with weakness and some lethargy/Generalized weakness - This could be related to acute on chronic kidney injury superimposed on stage IIIb chronic kidney disease with high BUN causing uremia -Nephrology consult requested. recommends 24 hour urine protein. -IV fluids -good uop.  - Physical therapy consult pending -   COVID-19 PCR negative -pt's mentation better today -creat down to 1.84 (2.17) -BUN 102--99 ?poor muscle mass  2. Chronic bilateral lower extremity venous stasis with left leg venous stasis ulcer. -  Wound care consult .   -There is no clear evidence for infection and his procalcitonin was 0.22 with no leukocytosis or clear signs of cellulitis. -continue wound care per recs  3.  Chronic systolic CHF.  No evidence for current exacerbation.  - We are holding off diuretic therapy while cautiously hydrating him and following BMP.  4.  Paroxysmal atrial fibrillation.   -He is currently in paced rhythm.  Eliquis will be continued. . 5.  COPD.  - He has no current exacerbation.  Status post tracheostomy.  Suction as needed -Speech therapy to follow swallow evaulation--rec mech soft diet -MBSS was done--->mild oropharyngeal dysphagia characterized by slow/prolonged posterior transfer, delayed pharyngeal pressure generation, and mild pharyngeal residue.  The was no observed laryngeal penetration observed across consistencies and methods of intake, including straw.  The patient was somewhat lethargic but was able to swallow safely.  Recommend continuing with his current diet, Dysphagia 3 with thin liquids.  6.  Hypertension. -  continue metoprolol bid  - hold off lisinopril given acute kidney injury.  7.  Gout.  Allopurinol   8.  GERD.  PPI therapy   9.  Dyslipidemia.  Statin therapy   10.  DVT prophylaxis.   on Eliquis  11. Recent left parietal-occipital small hematoma  S/p suture on 04/15/2019--repeat CT head 04/17/19--shows decreasing hematoma   Pt has been accept at [facility] for [facility] by Dr [facility]d [facility]  CONSULTS OBTAINED:  Treatment Team:  Murlean Iba, MD  DRUG  ALLERGIES:  No Known Allergies  DISCHARGE MEDICATIONS:   Allergies as of 04/20/2019   No Known Allergies     Medication List    STOP taking these medications   lisinopril 5  MG tablet Commonly known as: ZESTRIL   metolazone 2.5 MG tablet Commonly known as: ZAROXOLYN   metoprolol succinate 100 MG 24 hr tablet Commonly known as: TOPROL-XL   torsemide 20 MG tablet Commonly known as: DEMADEX     TAKE these medications   acetaminophen 325 MG tablet Commonly known as: TYLENOL Take 650 mg by mouth every 6 (six) hours as needed for mild pain, fever or headache.   allopurinol 100 MG tablet Commonly known as: ZYLOPRIM Take 100 mg by mouth daily.   apixaban 5 MG Tabs tablet Commonly known as: ELIQUIS Take 5 mg by mouth 2 (two) times daily.   ascorbic acid 500 MG tablet Commonly known as: VITAMIN C Take 500 mg by mouth 2 (two) times daily.   atorvastatin 40 MG tablet Commonly known as: LIPITOR Take 40 mg by mouth at bedtime.   cholecalciferol 25 MCG (1000 UNIT) tablet Commonly known as: VITAMIN D Take 5,000 Units by mouth daily.   dextromethorphan-guaiFENesin 30-600 MG 12hr tablet Commonly known as: MUCINEX DM Take 1 tablet by mouth 2 (two) times daily.   docusate sodium 100 MG capsule Commonly known as: COLACE Take 100 mg by mouth daily as needed for mild constipation.   ferrous sulfate 324 MG Tbec Take 324 mg by mouth daily.   gabapentin 100 MG capsule Commonly known as: NEURONTIN Take 100 mg by mouth every 12 (twelve) hours.   metoprolol tartrate 50 MG tablet Commonly known as: LOPRESSOR Take 1 tablet (50 mg total) by mouth 2 (two) times daily.   mirtazapine 7.5 MG tablet Commonly known as: REMERON Take 7.5 mg by mouth at bedtime.   multivitamin tablet Take 1 tablet by mouth daily.   oxyCODONE 5 MG immediate release tablet Commonly known as: Oxy IR/ROXICODONE Take 5 mg by mouth every 6 (six) hours as needed for severe pain.   pantoprazole 40 MG tablet Commonly known as: PROTONIX Take 40 mg by mouth daily.   Zinc Sulfate 220 (50 Zn) MG Tabs Take 220 mg by mouth every evening.       If you experience worsening of your  admission symptoms, develop shortness of breath, life threatening emergency, suicidal or homicidal thoughts you must seek medical attention immediately by calling 911 or calling your MD immediately  if symptoms less severe.  You Must read complete instructions/literature along with all the possible adverse reactions/side effects for all the Medicines you take and that have been prescribed to you. Take any new Medicines after you have completely understood and accept all the possible adverse reactions/side effects.   Please note  You were cared for by a hospitalist during your hospital stay. If you have any questions about your discharge medications or the care you received while you were in the hospital after you are discharged, you can call the unit and asked to speak with the hospitalist on call if the hospitalist that took care of you is not available. Once you are discharged, your primary care physician will handle any further medical issues. Please note that NO REFILLS for any discharge medications will be authorized once you are discharged, as it is imperative that you return to your primary care physician (or establish a relationship with a primary care physician if you do not have one) for your  aftercare needs so that they can reassess your need for medications and monitor your lab values. Today   SUBJECTIVE   More awake. Alert and oriented x2 No resp distress.  VITAL SIGNS:  Blood pressure (!) 131/58, pulse 70, temperature 97.7 F (36.5 C), temperature source Oral, resp. rate 16, height 5\' 9"  (1.753 m), weight 90.7 kg, SpO2 96 %.  I/O:    Intake/Output Summary (Last 24 hours) at 04/20/2019 0750 Last data filed at 04/20/2019 0600 Gross per 24 hour  Intake 2368.53 ml  Output 575 ml  Net 1793.53 ml    PHYSICAL EXAMINATION:  GENERAL:  69 y.o.-year-old patient lying in the bed with no acute distress.  Chronically ill EYES: Pupils equal, round, reactive to light and accommodation. No  scleral icterus.  HEENT: Head atraumatic, normocephalic. Oropharynx and nasopharynx clear.  NECK:  Supple, no jugular venous distention. No thyroid enlargement, no tenderness. Trach+ LUNGS: Normal breath sounds bilaterally, no wheezing, rales,rhonchi or crepitation. No use of accessory muscles of respiration.  CARDIOVASCULAR: S1, S2 normal. No murmurs, rubs, or gallops.  ABDOMEN: Soft, non-tender, non-distended. Bowel sounds present. No organomegaly or mass.  EXTREMITIES:      NEUROLOGIC: Cranial nerves II through XII are intact. Muscle strength 4/5 in all extremities. Sensation intact. Gait not checked.  PSYCHIATRIC: The patient is alert and oriented x 2.  SKIN: as above   DATA REVIEW:   CBC  Recent Labs  Lab 04/18/19 0625  WBC 7.7  HGB 9.2*  HCT 31.6*  PLT 159    Chemistries  Recent Labs  Lab 04/17/19 2337 04/17/19 2337 04/18/19 0703  NA 134*   < > 136  K 4.9   < > 4.7  CL 96*   < > 98  CO2 27   < > 25  GLUCOSE 106*   < > 103*  BUN 102*   < > 99*  CREATININE 2.15*   < > 1.84*  CALCIUM 8.8*   < > 8.7*  MG  --   --  1.7  AST 21  --   --   ALT 15  --   --   ALKPHOS 95  --   --   BILITOT 1.6*  --   --    < > = values in this interval not displayed.    Microbiology Results   Recent Results (from the past 240 hour(s))  Urine culture     Status: None   Collection Time: 04/17/19 11:39 PM   Specimen: Urine, Clean Catch  Result Value Ref Range Status   Specimen Description   Final    URINE, CLEAN CATCH Performed at [facility], [address], [place], [place]    Special Requests   Final    NONE Performed at [facility], [address] [place], [place]    Culture   Final    NO GROWTH Performed at [facility], [address] [place], [place]    Report Status 04/19/2019 FINAL  Final  SARS CORONAVIRUS 2 (TAT 6-24 HRS) Nasopharyngeal Nasopharyngeal Swab     Status: None   Collection Time: 04/18/19   1:40 AM   Specimen: Nasopharyngeal Swab  Result Value Ref Range Status   SARS Coronavirus 2 NEGATIVE NEGATIVE Final    Comment: (NOTE) SARS-CoV-2 target nucleic acids are NOT DETECTED. The SARS-CoV-2 RNA is generally detectable in upper and lower respiratory specimens during the acute phase of infection. Negative results do not preclude  SARS-CoV-2 infection, do not rule out co-infections with other pathogens, and should not be used as the sole basis for treatment or other patient management decisions. Negative results must be combined with clinical observations, patient history, and epidemiological information. The expected result is Negative. Fact Sheet for Patients: HairSlick.no Fact Sheet for Healthcare Providers: quierodirigir.com This test is not yet approved or cleared by the [place] FDA and  has been authorized for detection and/or diagnosis of SARS-CoV-2 by FDA under an Emergency Use Authorization (EUA). This EUA will remain  in effect (meaning this test can be used) for the duration of the COVID-19 declaration under Section 56 4(b)(1) of the Act, 21 U.S.C. section 360bbb-3(b)(1), unless the authorization is terminated or revoked sooner. Performed at [facility], [address], [place], [place] [address]     RADIOLOGY:  No results found.   CODE STATUS:     Code Status Orders  (From admission, onward)         Start     Ordered   04/18/19 1239  Do not attempt resuscitation (DNR)  Continuous    Question Answer Comment  In the event of cardiac or respiratory ARREST Do not call a "code blue"   In the event of cardiac or respiratory ARREST Do not perform Intubation, CPR, defibrillation or ACLS   In the event of cardiac or respiratory ARREST Use medication by any route, position, wound care, and other measures to relive pain and suffering. May use oxygen, suction and manual treatment of airway  obstruction as needed for comfort.      04/18/19 1252        Code Status History    Date Active Date Inactive Code Status Order ID Comments User Context   04/18/2019 0418 04/18/2019 1252 Full Code [id number]  [facility] Vernetta Honey, MD ED   03/27/2019 1603 04/03/2019 1932 Full Code [id number]  Darlin Priestly, MD ED   Advance Care Planning Activity       TOTAL TIME TAKING CARE OF THIS PATIENT: *40* minutes.    Enedina Finner M.D  [facility]    CC: Primary care physician; System, Pcp Not In

## 2019-05-06 DEATH — deceased

## 2021-04-06 IMAGING — CR DG CHEST 1V PORT
1 series · 1 of 1 positions shown · non-contrast
Comparison: 07/17/2018

CLINICAL DATA: Generalized weakness, falls

EXAM:
PORTABLE CHEST 1 VIEW

[dg chest port 1 view]
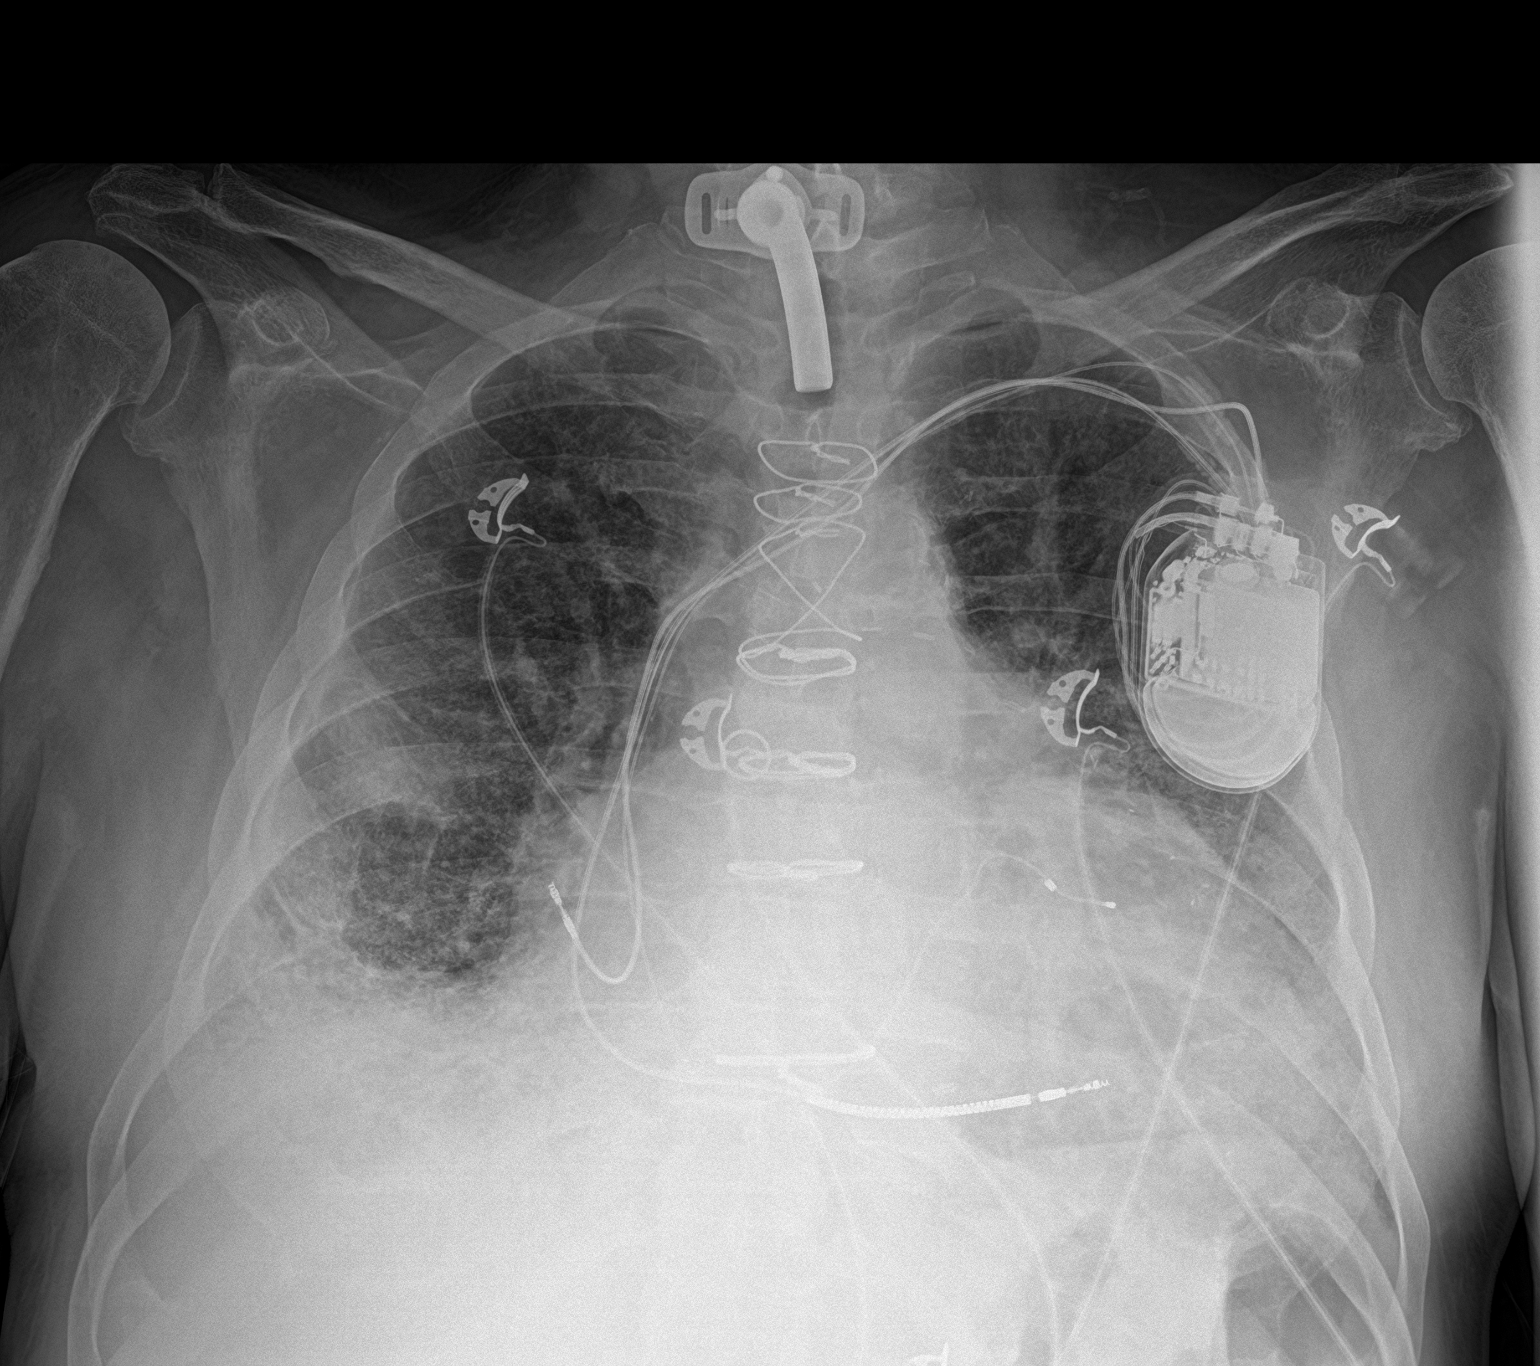

[1 of 1 positions shown; findings below may reference images not displayed]

FINDINGS: Tracheostomy device is present. Chronic interstitial prominence.
There is increased density at the right greater than left lung base.
Stable cardiomediastinal contours. Left chest wall biventricular ICD
is present.
IMPRESSION: Increased density at the bases, which may reflect
atelectasis/consolidation or scarring.

## 2021-04-28 IMAGING — CT CT HEAD W/O CM
3 series · 14 of 47 positions shown, 16 images · non-contrast
Comparison: CT head 04/15/2019

CLINICAL DATA: Altered mental status

EXAM:
CT HEAD WITHOUT CONTRAST
TECHNIQUE: Contiguous axial images were obtained from the base of the skull
through the vertex without intravenous contrast.

[Series 2: head wo · axial · 0.44mm/px · z∈[-70,+55]mm · 8 of 30 slices shown, 10 images]
[im 3/30  brain]
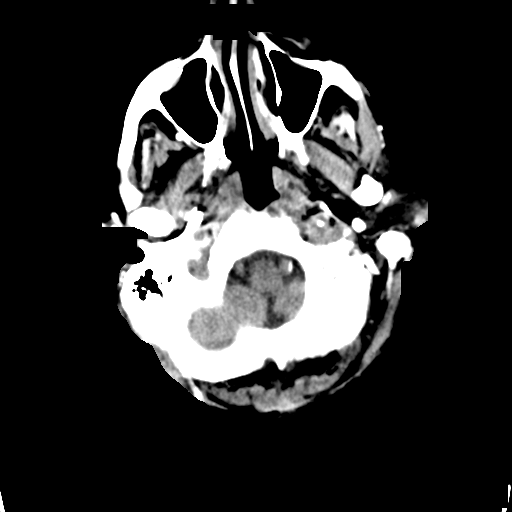
[im 3/30  bone]
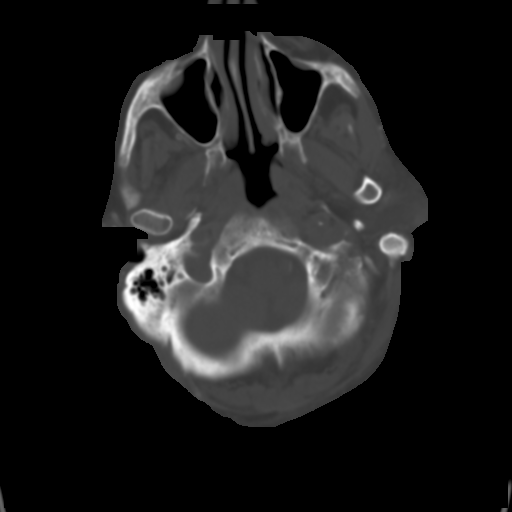
[im 7/30  brain]
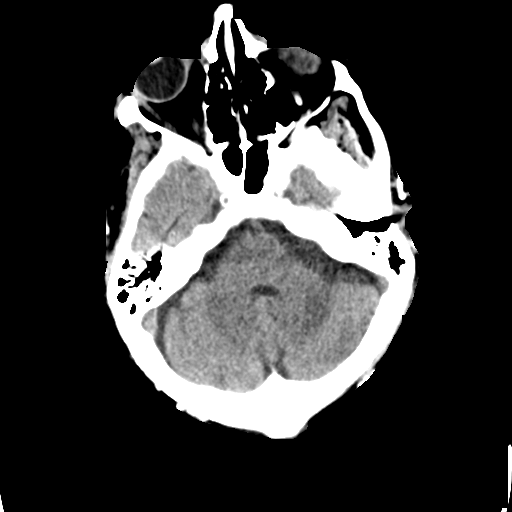
[im 10/30  brain]
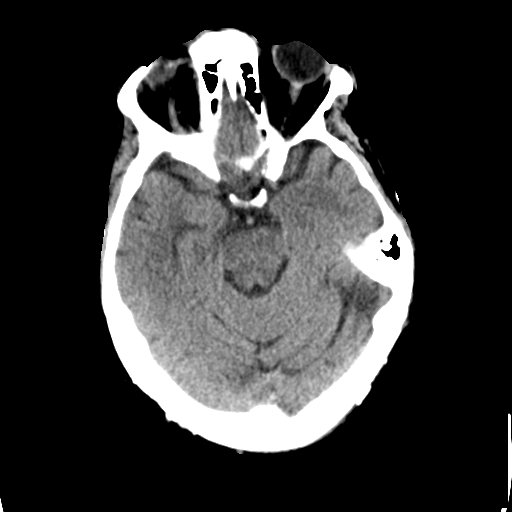
[im 14/30  brain]
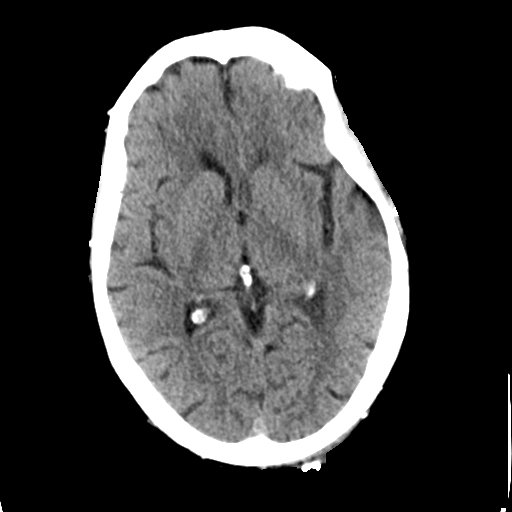
[im 17/30  brain]
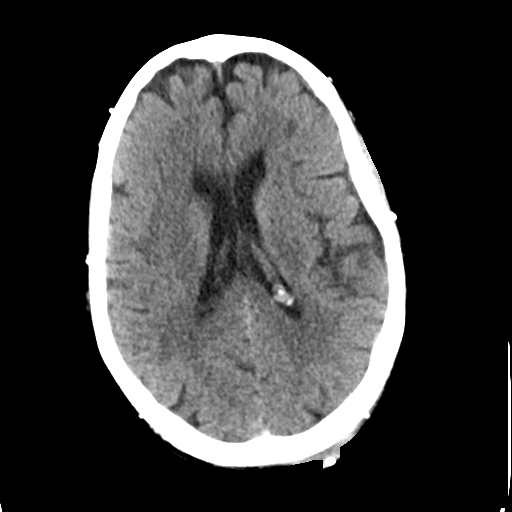
[im 17/30  bone]
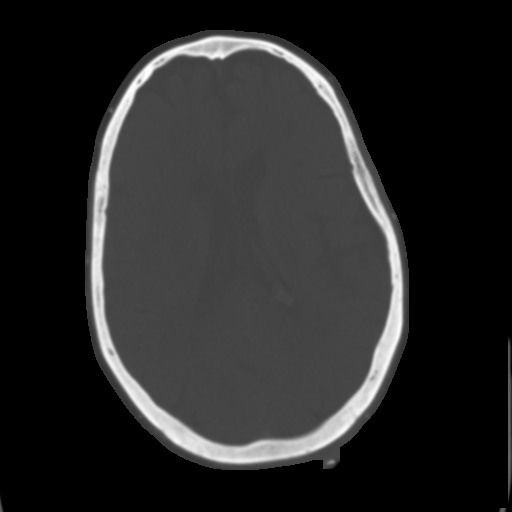
[im 21/30  brain]
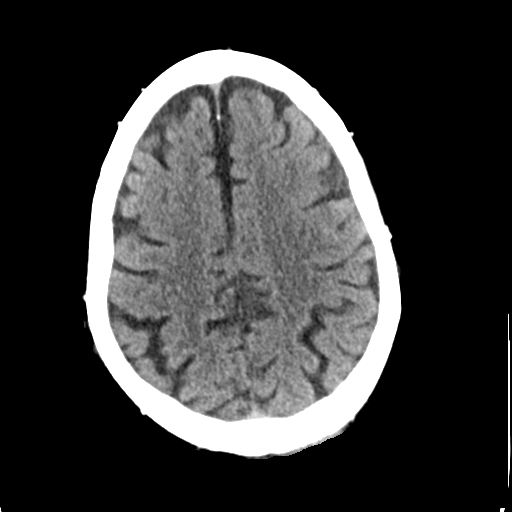
[im 24/30  brain]
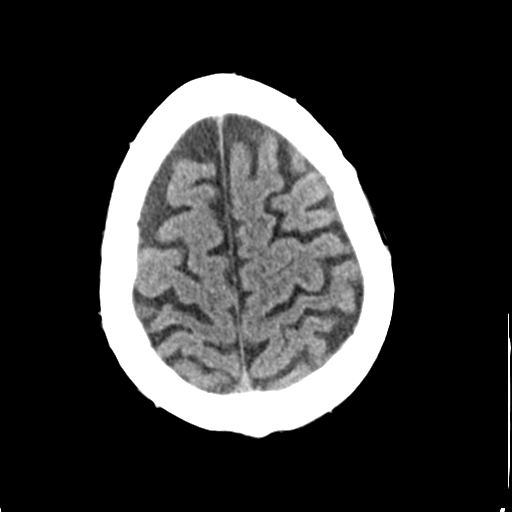
[im 28/30  brain]
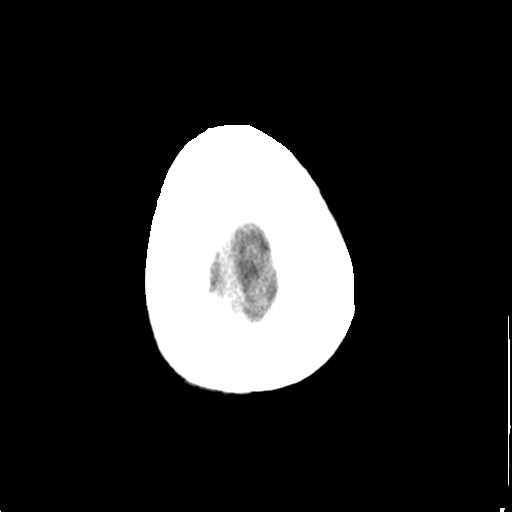

[Series 4: coronal soft tissue · coronal · 0.30mm/px · 3 of 73 slices shown]
[im 25/73  brain]
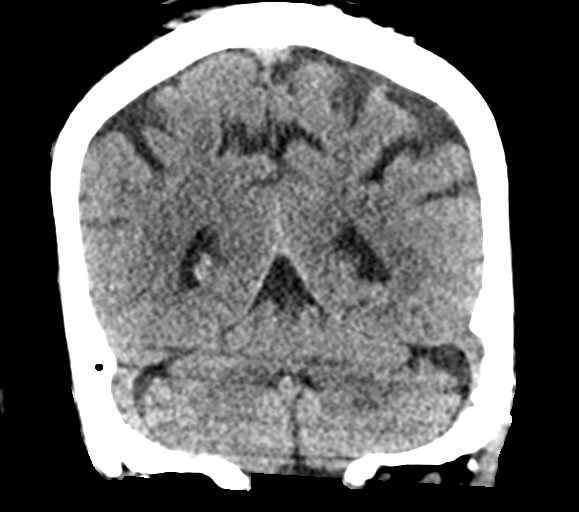
[im 33/73  brain]
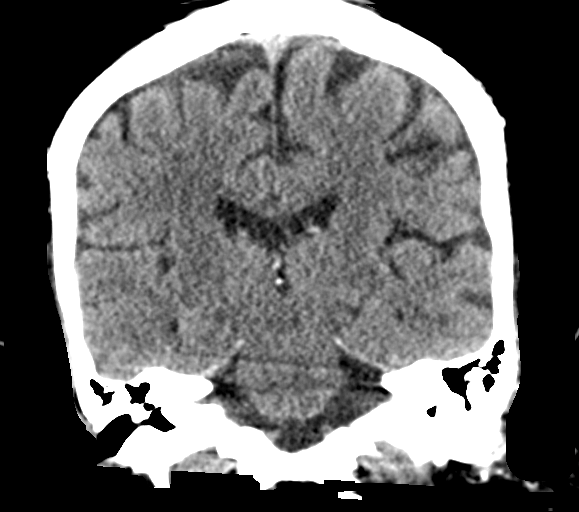
[im 41/73  brain]
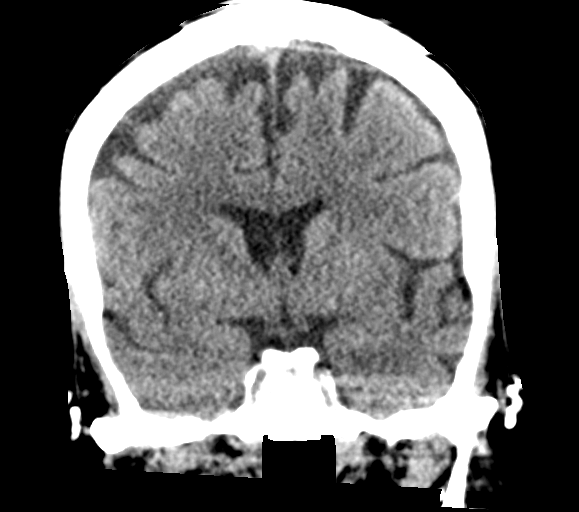

[Series 5: sagittal soft tissue · sagittal · 0.30mm/px · 3 of 58 slices shown]
[im 20/58  brain]
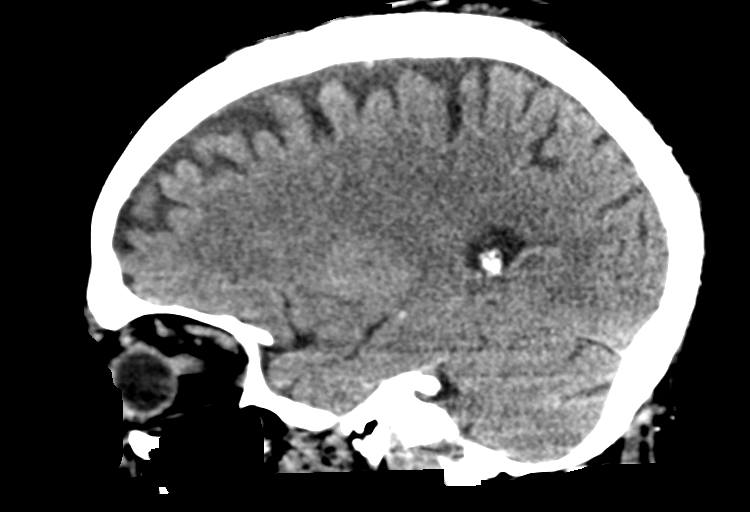
[im 29/58  brain]
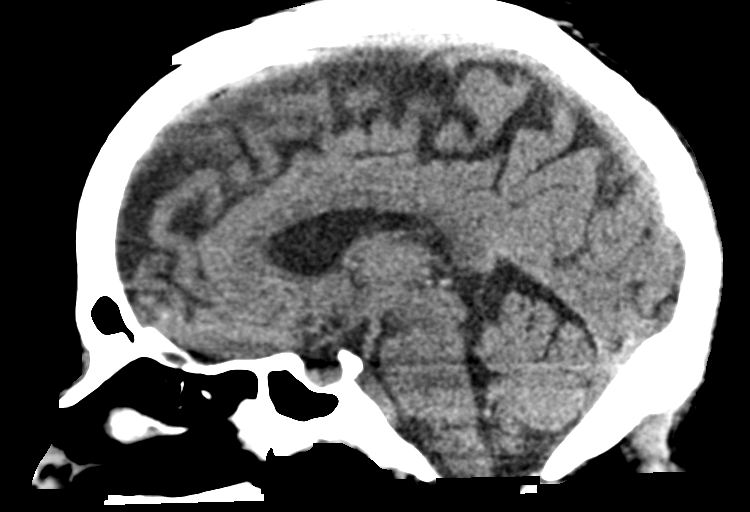
[im 39/58  brain]
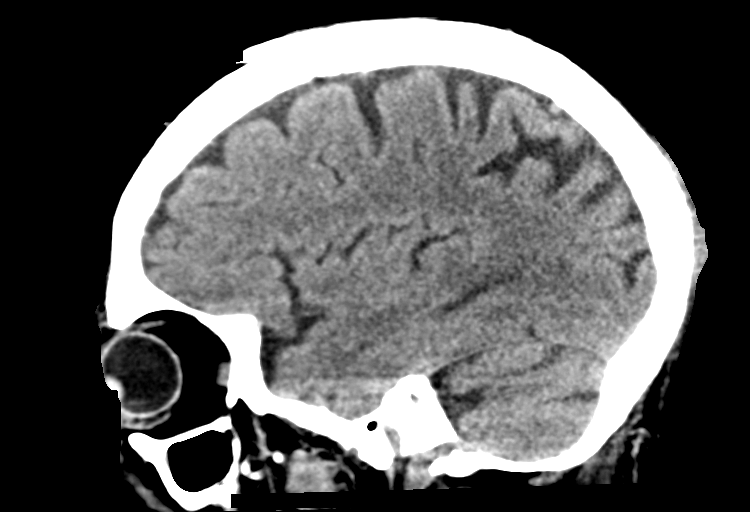

[14 of 47 positions shown; findings below may reference images not displayed]

FINDINGS: Brain: Stable focus of hypoattenuation in the right basal ganglia
likely reflecting sequela of prior lacunar infarct. No evidence of
acute infarction, hemorrhage, hydrocephalus, extra-axial collection
or mass lesion/mass effect. Symmetric prominence of the ventricles,
cisterns and sulci compatible with parenchymal volume loss. Patchy
areas of white matter hypoattenuation are most compatible with
chronic microvascular angiopathy.

Vascular: Atherosclerotic calcification of the carotid siphons and
intradural vertebral arteries. No hyperdense vessel.

Skull: Redemonstrated area of posterior left parieto-occipital scalp
swelling and small crescentic hematoma measuring up to 4 mm in
maximal thickness with overlying skin staples. No subjacent
calvarial fracture. No suspicious osseous lesion.

Sinuses/Orbits: Paranasal sinuses and mastoid air cells are
predominantly clear. Included orbital structures are unremarkable.

Other: None
IMPRESSION: 1. No acute intracranial abnormality.
2. Stable chronic microvascular angiopathy and parenchymal volume
loss.
3. Stable remote right basal ganglia lacunar infarct.
4. Decreased left parieto-occipital scalp swelling with overlying
staples. No subjacent calvarial fracture.
